# Patient Record
Sex: Male | Born: 1995 | Race: White | Hispanic: No | Marital: Single | Smoking: Current some day smoker
Health system: Southern US, Community
[De-identification: ages and names within clinical notes are randomized; demographics above are authoritative.]

## PROBLEM LIST (undated history)

## (undated) DIAGNOSIS — F32A Depression, unspecified: Secondary | ICD-10-CM

## (undated) DIAGNOSIS — G8929 Other chronic pain: Secondary | ICD-10-CM

## (undated) DIAGNOSIS — F329 Major depressive disorder, single episode, unspecified: Secondary | ICD-10-CM

## (undated) DIAGNOSIS — M92529 Juvenile osteochondrosis of tibia tubercle, unspecified leg: Secondary | ICD-10-CM

## (undated) DIAGNOSIS — IMO0002 Reserved for concepts with insufficient information to code with codable children: Secondary | ICD-10-CM

## (undated) DIAGNOSIS — F419 Anxiety disorder, unspecified: Secondary | ICD-10-CM

## (undated) DIAGNOSIS — M7650 Patellar tendinitis, unspecified knee: Secondary | ICD-10-CM

## (undated) DIAGNOSIS — M9250 Juvenile osteochondrosis of tibia and fibula, unspecified leg: Secondary | ICD-10-CM

## (undated) DIAGNOSIS — M25569 Pain in unspecified knee: Secondary | ICD-10-CM

## (undated) HISTORY — DX: Anxiety disorder, unspecified: F41.9

## (undated) HISTORY — PX: WISDOM TOOTH EXTRACTION: SHX21

---
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
## 2015-02-24 ENCOUNTER — Emergency Department (HOSPITAL_COMMUNITY)
Admission: EM | Admit: 2015-02-24 | Discharge: 2015-02-25 | Disposition: A | Discharge status: Home / Self Care | Payer: BLUE CROSS/BLUE SHIELD | Attending: Emergency Medicine | Admitting: Emergency Medicine

## 2015-02-24 ENCOUNTER — Encounter (HOSPITAL_COMMUNITY): Payer: Self-pay | Admitting: *Deleted

## 2015-02-24 DIAGNOSIS — M25561 Pain in right knee: Secondary | ICD-10-CM | POA: Diagnosis present

## 2015-02-24 DIAGNOSIS — F329 Major depressive disorder, single episode, unspecified: Secondary | ICD-10-CM | POA: Insufficient documentation

## 2015-02-24 DIAGNOSIS — R55 Syncope and collapse: Secondary | ICD-10-CM

## 2015-02-24 DIAGNOSIS — Z79899 Other long term (current) drug therapy: Secondary | ICD-10-CM | POA: Diagnosis not present

## 2015-02-24 DIAGNOSIS — G8929 Other chronic pain: Secondary | ICD-10-CM | POA: Insufficient documentation

## 2015-02-24 DIAGNOSIS — F419 Anxiety disorder, unspecified: Secondary | ICD-10-CM | POA: Insufficient documentation

## 2015-02-24 DIAGNOSIS — R000 Tachycardia, unspecified: Secondary | ICD-10-CM | POA: Diagnosis not present

## 2015-02-24 NOTE — ED Notes (Signed)
Pt states he has been having right knee pain an has been seeing an orthopedic doctor for it. Pt is on crutches that he had at home and has been using those for several months. Pt states that he began having worsening knee pain today and he has fallen several times a day. Mother states she has an app on her phone that measures heart rates. Mother states the highest her sons heart rate has been was 151.

## 2015-02-25 LAB — CBC WITH DIFFERENTIAL/PLATELET
Basophils Absolute: 0.2 10*3/uL — ABNORMAL HIGH (ref 0.0–0.1)
Basophils Relative: 2 % — ABNORMAL HIGH (ref 0–1)
EOS ABS: 0.8 10*3/uL — AB (ref 0.0–0.7)
EOS PCT: 8 % — AB (ref 0–5)
HEMATOCRIT: 43.6 % (ref 39.0–52.0)
Hemoglobin: 15 g/dL (ref 13.0–17.0)
LYMPHS PCT: 32 % (ref 12–46)
Lymphs Abs: 3.3 10*3/uL (ref 0.7–4.0)
MCH: 30.4 pg (ref 26.0–34.0)
MCHC: 34.4 g/dL (ref 30.0–36.0)
MCV: 88.4 fL (ref 78.0–100.0)
MONOS PCT: 8 % (ref 3–12)
Monocytes Absolute: 0.9 10*3/uL (ref 0.1–1.0)
NEUTROS ABS: 5.3 10*3/uL (ref 1.7–7.7)
Neutrophils Relative %: 50 % (ref 43–77)
PLATELETS: 200 10*3/uL (ref 150–400)
RBC: 4.93 MIL/uL (ref 4.22–5.81)
RDW: 13.2 % (ref 11.5–15.5)
WBC: 10.4 10*3/uL (ref 4.0–10.5)

## 2015-02-25 LAB — BASIC METABOLIC PANEL
ANION GAP: 9 (ref 5–15)
BUN: 17 mg/dL (ref 6–23)
CHLORIDE: 104 mmol/L (ref 96–112)
CO2: 28 mmol/L (ref 19–32)
Calcium: 9.1 mg/dL (ref 8.4–10.5)
Creatinine, Ser: 0.99 mg/dL (ref 0.50–1.35)
GFR calc Af Amer: >90 mL/min (ref >90)
GLUCOSE: 88 mg/dL (ref 70–99)
Potassium: 4 mmol/L (ref 3.5–5.1)
SODIUM: 141 mmol/L (ref 135–145)

## 2015-02-25 NOTE — ED Provider Notes (Addendum)
CSN: [id number]     Arrival date & time 02/24/15  2250 History   First MD Initiated Contact with Patient 02/25/15 0147     Chief Complaint  Patient presents with  . Knee Pain     (Consider location/radiation/quality/duration/timing/severity/associated sxs/prior Treatment) HPI  This is an 19 year old male with chronic pain in the right knee for over a year. He is currently being seen by an orthopedist in [place] as well as a pain clinic in [place]. He is currently on Neurontin, Lexapro and Belsomra. He states he is no longer taking the oxycodone he had been prescribed in March. He ambulates with crutches or a cane. He is here because for the past week he has been having episodes where he becomes weak and falls to the ground. He denies loss of consciousness. He is concerned that these falls may cause injury. He has been intermittently tachycardic and his mother states his heart rate has been as high as 151. He states the rapid heartbeat usually follows the episodes of weakness, not preceding them. He denies chest pain. He states the episodes are becoming more frequent.  Past Medical History  Diagnosis Date  . Osgood-Schlatter's disease   . Depression   . Anxiety   . Patellar tendonitis   . Chronic knee pain    History reviewed. No pertinent past surgical history. Family History  Problem Relation Age of Onset  . Diabetes Father   . ADD / ADHD Father   . Bipolar disorder Father   . Anxiety disorder Father   . Alcohol abuse Father   . Alcohol abuse Maternal Grandfather   . Anxiety disorder Maternal Grandfather   . Depression Maternal Grandfather   . Dementia Neg Hx   . Drug abuse Neg Hx   . OCD Neg Hx   . Paranoid behavior Neg Hx   . Schizophrenia Neg Hx   . Seizures Neg Hx   . Sexual abuse Neg Hx   . Physical abuse Neg Hx    History  Substance Use Topics  . Smoking status: Never Smoker   . Smokeless tobacco: Never Used  . Alcohol Use: No    Review of Systems   All other systems reviewed and are negative.   Allergies  Risperdal  Home Medications   Prior to Admission medications   Medication Sig Start Date End Date Taking? Authorizing Provider  escitalopram (LEXAPRO) 10 MG tablet Take 20 mg by mouth at bedtime.    Yes Historical Provider, MD  gabapentin (NEURONTIN) 600 MG tablet Take 600 mg by mouth 3 (three) times daily.   Yes Historical Provider, MD  hydroxypropyl methylcellulose / hypromellose (ISOPTO TEARS / GONIOVISC) 2.5 % ophthalmic solution Place 1 drop into both eyes as needed for dry eyes.   Yes Historical Provider, MD  Melatonin 5 MG TABS Take 5 mg by mouth at bedtime as needed (Sleep).    Yes Historical Provider, MD  oxymetazoline (AFRIN) 0.05 % nasal spray Place 2 sprays into the nose 2 (two) times daily as needed. For congestion    Yes Historical Provider, MD  Suvorexant (BELSOMRA) 10 MG TABS Take 10 mg by mouth at bedtime.   Yes Historical Provider, MD  PRESCRIPTION MEDICATION Apply 1 application topically 3 (three) times daily as needed (Pain). Diclofenac/Lidocaine/Menthol Cream [facility] in [place], [place]. Applies to Knee    Historical Provider, MD   BP 112/70 mmHg  Pulse 97  Temp(Src) 99.2 F (37.3 C) (Oral)  Resp 20  Ht 6' (  1.829 m)  Wt 157 lb 9.6 oz (71.487 kg)  BMI 21.37 kg/m2  SpO2 100%   Physical Exam  General: Well-developed, well-nourished male in no acute distress; appearance consistent with age of record HENT: normocephalic; atraumatic Eyes: pupils equal, round and reactive to light; extraocular muscles intact Neck: supple Heart: regular rate and rhythm; no murmur Lungs: clear to auscultation bilaterally Abdomen: soft; nondistended; nontender; no masses or hepatosplenomegaly; bowel sounds present Extremities: No deformity; full range of motion except right knee; pulses normal; tenderness of right knee with pain on attempted passive range of motion Neurologic: Awake, alert and oriented; motor  function intact in all extremities and symmetric; no facial droop Skin: Warm and dry Psychiatric: Normal mood and affect    ED Course  Procedures (including critical care time)   EKG Interpretation   Date/Time:  Saturday February 25 2015 00:28:19 EDT Ventricular Rate:  87 PR Interval:  105 QRS Duration: 84 QT Interval:  342 QTC Calculation: 411 R Axis:   90 Text Interpretation:  Sinus rhythm Short PR interval Borderline right axis  deviation ST elev, probable normal early repol pattern No old tracing to  compare Confirmed by [facility]  MD, Jonny RuizJOHN ([id number]) on 02/25/2015 1:03:16 AM      MDM  Nursing notes and vitals signs, including pulse oximetry, reviewed.  Summary of this visit's results, reviewed by myself:  Labs:  Results for orders placed or performed during the hospital encounter of 02/24/15 (from the past 24 hour(s))  CBC with Differential/Platelet     Status: Abnormal   Collection Time: 02/25/15  2:20 AM  Result Value Ref Range   WBC 10.4 4.0 - 10.5 K/uL   RBC 4.93 4.22 - 5.81 MIL/uL   Hemoglobin 15.0 13.0 - 17.0 g/dL   HCT 86.543.6 78.439.0 - 69.652.0 %   MCV 88.4 78.0 - 100.0 fL   MCH 30.4 26.0 - 34.0 pg   MCHC 34.4 30.0 - 36.0 g/dL   RDW 29.513.2 28.411.5 - 13.215.5 %   Platelets 200 150 - 400 K/uL   Neutrophils Relative % 50 43 - 77 %   Neutro Abs 5.3 1.7 - 7.7 K/uL   Lymphocytes Relative 32 12 - 46 %   Lymphs Abs 3.3 0.7 - 4.0 K/uL   Monocytes Relative 8 3 - 12 %   Monocytes Absolute 0.9 0.1 - 1.0 K/uL   Eosinophils Relative 8 (H) 0 - 5 %   Eosinophils Absolute 0.8 (H) 0.0 - 0.7 K/uL   Basophils Relative 2 (H) 0 - 1 %   Basophils Absolute 0.2 (H) 0.0 - 0.1 K/uL  Basic metabolic panel     Status: None   Collection Time: 02/25/15  2:20 AM  Result Value Ref Range   Sodium 141 135 - 145 mmol/L   Potassium 4.0 3.5 - 5.1 mmol/L   Chloride 104 96 - 112 mmol/L   CO2 28 19 - 32 mmol/L   Glucose, Bld 88 70 - 99 mg/dL   BUN 17 6 - 23 mg/dL   Creatinine, Ser 4.400.99 0.50 - 1.35 mg/dL    Calcium 9.1 8.4 - 10.210.5 mg/dL   GFR calc non Af Amer >90 >90 mL/min   GFR calc Af Amer >90 >90 mL/min   Anion gap 9 5 - 15   2:57 AM Patient mother advised of lab findings and orthostatics. The patient has been noted to be in sinus tachycardia intermittently. We will refer to cardiology for further evaluation and likely monitoring. They were advised that  this could also represent an adverse reaction to gabapentin.      Paula Libra, MD 02/25/15 4098  Paula Libra, MD 02/25/15 1191

## 2015-04-08 ENCOUNTER — Emergency Department (HOSPITAL_COMMUNITY)
Admission: EM | Admit: 2015-04-08 | Discharge: 2015-04-08 | Disposition: A | Discharge status: Home / Self Care | Payer: BLUE CROSS/BLUE SHIELD | Attending: Emergency Medicine | Admitting: Emergency Medicine

## 2015-04-08 ENCOUNTER — Encounter (HOSPITAL_COMMUNITY): Payer: Self-pay | Admitting: *Deleted

## 2015-04-08 DIAGNOSIS — F419 Anxiety disorder, unspecified: Secondary | ICD-10-CM | POA: Insufficient documentation

## 2015-04-08 DIAGNOSIS — Z79899 Other long term (current) drug therapy: Secondary | ICD-10-CM | POA: Insufficient documentation

## 2015-04-08 DIAGNOSIS — R000 Tachycardia, unspecified: Secondary | ICD-10-CM | POA: Insufficient documentation

## 2015-04-08 DIAGNOSIS — Z720 Tobacco use: Secondary | ICD-10-CM | POA: Diagnosis not present

## 2015-04-08 DIAGNOSIS — M25461 Effusion, right knee: Secondary | ICD-10-CM | POA: Diagnosis not present

## 2015-04-08 DIAGNOSIS — F329 Major depressive disorder, single episode, unspecified: Secondary | ICD-10-CM | POA: Insufficient documentation

## 2015-04-08 DIAGNOSIS — M25562 Pain in left knee: Secondary | ICD-10-CM | POA: Insufficient documentation

## 2015-04-08 DIAGNOSIS — G8929 Other chronic pain: Secondary | ICD-10-CM | POA: Diagnosis not present

## 2015-04-08 DIAGNOSIS — M25561 Pain in right knee: Secondary | ICD-10-CM | POA: Diagnosis not present

## 2015-04-08 MED ORDER — OXYCODONE-ACETAMINOPHEN 5-325 MG PO TABS: 2.0000 | ORAL_TABLET | ORAL | Status: DC | PRN

## 2015-04-08 MED ORDER — OXYCODONE-ACETAMINOPHEN 5-325 MG PO TABS
1.0000 | ORAL_TABLET | Freq: Once | ORAL | Status: AC
  Administered 2015-04-08: 1 via ORAL
  Filled 2015-04-08: qty 1

## 2015-04-08 NOTE — ED Provider Notes (Signed)
CSN: [id number]     Arrival date & time 04/08/15  1439 History   First MD Initiated Contact with Patient 04/08/15 1756     Chief Complaint  Patient presents with  . Knee Pain     (Consider location/radiation/quality/duration/timing/severity/associated sxs/prior Treatment) HPI Johnathan Lane is a 19 y.o. male who presents to the ED with knee pain. Hx of Osgood-Schlatter's disease, depression, anxiety, patellar tendonitis and chronic knee pain. His problem is partly due to one leg being shorter than the other and having subluxation of the right knee. Now he is having problems with his left knee. The symptoms have gotten progressively worse over the past week. He had been followed by Dr. Hilda Lias but has been referred to an orthopedic doctor at [facility]. His next appointment is June 8th.   Past Medical History  Diagnosis Date  . Osgood-Schlatter's disease   . Depression   . Anxiety   . Patellar tendonitis   . Chronic knee pain    Past Surgical History  Procedure Laterality Date  . Wisdom tooth extraction     Family History  Problem Relation Age of Onset  . Diabetes Father   . ADD / ADHD Father   . Bipolar disorder Father   . Anxiety disorder Father   . Alcohol abuse Father   . Alcohol abuse Maternal Grandfather   . Anxiety disorder Maternal Grandfather   . Depression Maternal Grandfather   . Dementia Neg Hx   . Drug abuse Neg Hx   . OCD Neg Hx   . Paranoid behavior Neg Hx   . Schizophrenia Neg Hx   . Seizures Neg Hx   . Sexual abuse Neg Hx   . Physical abuse Neg Hx    History  Substance Use Topics  . Smoking status: Current Some Day Smoker    Types: Cigarettes  . Smokeless tobacco: Never Used  . Alcohol Use: No    Review of Systems Negative except as stated in HPI   Allergies  Risperdal  Home Medications   Prior to Admission medications   Medication Sig Start Date End Date Taking? Authorizing Provider  acetaminophen (TYLENOL) 500 MG tablet Take 1,000 mg  by mouth every 6 (six) hours as needed for mild pain.   Yes Historical Provider, MD  busPIRone (BUSPAR) 10 MG tablet Take 10 mg by mouth 3 (three) times daily.   Yes Historical Provider, MD  escitalopram (LEXAPRO) 10 MG tablet Take 20 mg by mouth at bedtime.    Yes Historical Provider, MD  gabapentin (NEURONTIN) 400 MG capsule Take 1,600 mg by mouth 3 (three) times daily.   Yes Historical Provider, MD  levETIRAcetam (KEPPRA) 250 MG tablet Take 500 mg by mouth 2 (two) times daily.   Yes Historical Provider, MD  oxymetazoline (AFRIN) 0.05 % nasal spray Place 2 sprays into the nose 2 (two) times daily as needed. For congestion    Yes Historical Provider, MD  Suvorexant (BELSOMRA) 10 MG TABS Take 15 mg by mouth at bedtime.    Yes Historical Provider, MD  Melatonin 5 MG TABS Take 5 mg by mouth at bedtime as needed (Sleep).     Historical Provider, MD  oxyCODONE-acetaminophen (PERCOCET/ROXICET) 5-325 MG per tablet Take 2 tablets by mouth every 4 (four) hours as needed for severe pain. 04/08/15   Hope Orlene Och, NP   BP 114/64 mmHg  Pulse 113  Temp(Src) 97.6 F (36.4 C) (Oral)  SpO2 100% Physical Exam  Constitutional: He is oriented to person,  place, and time. He appears well-developed and well-nourished. No distress.  Eyes: EOM are normal.  Neck: Neck supple.  Cardiovascular: Tachycardia present.   Pulmonary/Chest: Effort normal.  Abdominal: Soft. There is no tenderness.  Musculoskeletal:       Right knee: He exhibits decreased range of motion (due to pain) and swelling. He exhibits no deformity, no laceration and no erythema. Tenderness found.       Left knee: He exhibits normal range of motion, no swelling, no ecchymosis, no deformity, no laceration, no erythema and normal alignment. Tenderness found.       Legs: Pedal pulses equal bilateral, adequate circulation.   Neurological: He is alert and oriented to person, place, and time. No cranial nerve deficit.  Skin: Skin is warm and dry.    Psychiatric: He has a normal mood and affect. His behavior is normal.  Nursing note and vitals reviewed.   ED Course  Procedures   MDM  19 y.o. male with chronic knee pain and followed by ortho. Discussed that he will need to see his doctor for continued pain management but we will give him a pre pack of oxycodone until he can call his doctor. Patient and his mother voice understanding and agree with plan.   Final diagnoses:  Bilateral chronic knee pain      [facility] Neese, NP 04/10/15 0245  Raeford RazorStephen Kohut, MD 04/11/15 [id number]

## 2015-04-08 NOTE — ED Notes (Addendum)
1 week history of knee pain worsening over course of week. Started having falls Wednesday, feeling as if knees lock and he falls onto them. States one leg is shorter than other and he has subluxation of R knee. Now L leg knee is problematic. Has Osgood-Schlatter's Dz.

## 2015-04-26 MED FILL — Oxycodone w/ Acetaminophen Tab 5-325 MG: ORAL | Qty: 6 | Status: AC

## 2015-05-12 ENCOUNTER — Encounter (HOSPITAL_COMMUNITY): Payer: Self-pay | Admitting: *Deleted

## 2015-05-12 ENCOUNTER — Emergency Department (HOSPITAL_COMMUNITY)
Admission: EM | Admit: 2015-05-12 | Discharge: 2015-05-12 | Disposition: A | Discharge status: Home / Self Care | Payer: BLUE CROSS/BLUE SHIELD | Attending: Emergency Medicine | Admitting: Emergency Medicine

## 2015-05-12 DIAGNOSIS — F329 Major depressive disorder, single episode, unspecified: Secondary | ICD-10-CM | POA: Insufficient documentation

## 2015-05-12 DIAGNOSIS — Z79899 Other long term (current) drug therapy: Secondary | ICD-10-CM | POA: Insufficient documentation

## 2015-05-12 DIAGNOSIS — F419 Anxiety disorder, unspecified: Secondary | ICD-10-CM | POA: Insufficient documentation

## 2015-05-12 DIAGNOSIS — Z8719 Personal history of other diseases of the digestive system: Secondary | ICD-10-CM | POA: Diagnosis not present

## 2015-05-12 DIAGNOSIS — G8929 Other chronic pain: Secondary | ICD-10-CM | POA: Diagnosis not present

## 2015-05-12 DIAGNOSIS — Z720 Tobacco use: Secondary | ICD-10-CM | POA: Insufficient documentation

## 2015-05-12 DIAGNOSIS — M25561 Pain in right knee: Secondary | ICD-10-CM | POA: Insufficient documentation

## 2015-05-12 MED ORDER — OXYCODONE-ACETAMINOPHEN 5-325 MG PO TABS: 2.0000 | ORAL_TABLET | ORAL | Status: DC | PRN

## 2015-05-12 MED ORDER — OXYCODONE-ACETAMINOPHEN 5-325 MG PO TABS
1.0000 | ORAL_TABLET | Freq: Once | ORAL | Status: AC
  Administered 2015-05-12: 1 via ORAL
  Filled 2015-05-12: qty 1

## 2015-05-12 NOTE — ED Notes (Signed)
Pt has a hx of right knee pain and is seen by the [facility]. Pt's knee started hurting on Thursday. Pt's mother called [facility] and they were told to come to the ED.

## 2015-05-12 NOTE — Discharge Instructions (Signed)
Follow up with your doctor at [facility] as planned. Return here as needed.

## 2015-05-12 NOTE — ED Provider Notes (Signed)
CSN: [id number]     Arrival date & time 05/12/15  2234 History   First MD Initiated Contact with Patient 05/12/15 2246     Chief Complaint  Patient presents with  . Knee Pain     (Consider location/radiation/quality/duration/timing/severity/associated sxs/prior Treatment) Patient is a 19 y.o. male presenting with knee pain. The history is provided by the patient and a parent.  Knee Pain Location:  Knee Injury: no   Knee location:  R knee Pain details:    Quality:  Aching and shooting   Severity:  Severe   Onset quality:  Gradual   Duration:  2 days   Timing:  Constant   Progression:  Worsening  Johnathan Lane is a 19 y.o. male who presents to the ED with his mother for right knee pain. Patient has a long history of knee problems. He had Osgood-Schlatter's disease and patellar tendonitis and developed chronic knee pain. He was evaluated by Dr. Hilda Lias and remained under his care for about a year and has been in PT. His care was transferred from Dr. Hilda Lias to [facility]. After a few months there the patient's mother transferred care to [facility]. At [facility] he sees an orthopedic doctor and goes to a pain clinic. They have scheduled him for a nerve block on the right that will take place in the next few weeks. He is not taking narcotics on a regular basis. He is taking Neurontin and Cymbalta. These medications have worked well but over the past 2 days he has had a flair up of the knee pain. Patient's mother called the pain clinic at [facility] today and was instructed to go to ED for pain medication and they would make a note in his chart.   Past Medical History  Diagnosis Date  . Osgood-Schlatter's disease   . Depression   . Anxiety   . Patellar tendonitis   . Chronic knee pain    Past Surgical History  Procedure Laterality Date  . Wisdom tooth extraction     Family History  Problem Relation Age of Onset  . Diabetes Father   . ADD / ADHD Father   . Bipolar disorder Father   . Anxiety  disorder Father   . Alcohol abuse Father   . Alcohol abuse Maternal Grandfather   . Anxiety disorder Maternal Grandfather   . Depression Maternal Grandfather   . Dementia Neg Hx   . Drug abuse Neg Hx   . OCD Neg Hx   . Paranoid behavior Neg Hx   . Schizophrenia Neg Hx   . Seizures Neg Hx   . Sexual abuse Neg Hx   . Physical abuse Neg Hx    History  Substance Use Topics  . Smoking status: Current Some Day Smoker    Types: Cigarettes  . Smokeless tobacco: Never Used  . Alcohol Use: No    Review of Systems Negative except as stated in HPI   Allergies  Risperdal  Home Medications   Prior to Admission medications   Medication Sig Start Date End Date Taking? Authorizing Provider  acetaminophen (TYLENOL) 500 MG tablet Take 1,000 mg by mouth every 6 (six) hours as needed for mild pain.    Historical Provider, MD  busPIRone (BUSPAR) 10 MG tablet Take 10 mg by mouth 3 (three) times daily.    Historical Provider, MD  escitalopram (LEXAPRO) 10 MG tablet Take 20 mg by mouth at bedtime.     Historical Provider, MD  gabapentin (NEURONTIN) 400 MG capsule  Take 1,600 mg by mouth 3 (three) times daily.    Historical Provider, MD  levETIRAcetam (KEPPRA) 250 MG tablet Take 500 mg by mouth 2 (two) times daily.    Historical Provider, MD  Melatonin 5 MG TABS Take 5 mg by mouth at bedtime as needed (Sleep).     Historical Provider, MD  oxyCODONE-acetaminophen (PERCOCET/ROXICET) 5-325 MG per tablet Take 2 tablets by mouth every 4 (four) hours as needed for severe pain. 05/12/15   Hope Orlene OchM Neese, NP  oxymetazoline (AFRIN) 0.05 % nasal spray Place 2 sprays into the nose 2 (two) times daily as needed. For congestion     Historical Provider, MD  Suvorexant (BELSOMRA) 10 MG TABS Take 15 mg by mouth at bedtime.     Historical Provider, MD   BP 109/60 mmHg  Pulse 86  Temp(Src) 98.4 F (36.9 C) (Oral)  Resp 14  Ht 6\' 1"  (1.854 m)  Wt 160 lb (72.576 kg)  BMI 21.11 kg/m2  SpO2 100% Physical Exam    Constitutional: He is oriented to person, place, and time. He appears well-developed and well-nourished. No distress.  HENT:  Head: Normocephalic and atraumatic.  Eyes: EOM are normal.  Neck: Neck supple.  Cardiovascular: Normal rate.   Pulmonary/Chest: Effort normal.  Musculoskeletal:       Right knee: He exhibits swelling. He exhibits no erythema and normal alignment. Decreased range of motion: due to pain. Tenderness found.       Legs: Pedal pulses 2+ bilateral.   Neurological: He is alert and oriented to person, place, and time. No cranial nerve deficit.  Skin: Skin is warm and dry.  Psychiatric: He has a normal mood and affect. His behavior is normal.  Nursing note and vitals reviewed.   ED Course  Procedures   MDM  19 y.o. male with right knee pain stable for d/c without neurovascular compromise. [facility]. Discussed with the patient and his mother plan of care and all questioned fully answered.   Final diagnoses:  Right knee pain       SweetserHope M Neese, NP 05/13/15 [phone]  Bethann BerkshireJoseph Zammit, MD 05/13/15 1323

## 2015-05-16 ENCOUNTER — Emergency Department (HOSPITAL_COMMUNITY): Payer: BLUE CROSS/BLUE SHIELD

## 2015-05-16 ENCOUNTER — Emergency Department (HOSPITAL_COMMUNITY)
Admission: EM | Admit: 2015-05-16 | Discharge: 2015-05-16 | Disposition: A | Discharge status: Home / Self Care | Payer: BLUE CROSS/BLUE SHIELD | Attending: Emergency Medicine | Admitting: Emergency Medicine

## 2015-05-16 ENCOUNTER — Encounter (HOSPITAL_COMMUNITY): Payer: Self-pay | Admitting: Emergency Medicine

## 2015-05-16 DIAGNOSIS — F419 Anxiety disorder, unspecified: Secondary | ICD-10-CM | POA: Diagnosis not present

## 2015-05-16 DIAGNOSIS — S8991XA Unspecified injury of right lower leg, initial encounter: Secondary | ICD-10-CM | POA: Diagnosis present

## 2015-05-16 DIAGNOSIS — W01198A Fall on same level from slipping, tripping and stumbling with subsequent striking against other object, initial encounter: Secondary | ICD-10-CM | POA: Diagnosis not present

## 2015-05-16 DIAGNOSIS — G8929 Other chronic pain: Secondary | ICD-10-CM | POA: Insufficient documentation

## 2015-05-16 DIAGNOSIS — Y9289 Other specified places as the place of occurrence of the external cause: Secondary | ICD-10-CM | POA: Insufficient documentation

## 2015-05-16 DIAGNOSIS — Y9389 Activity, other specified: Secondary | ICD-10-CM | POA: Insufficient documentation

## 2015-05-16 DIAGNOSIS — R200 Anesthesia of skin: Secondary | ICD-10-CM | POA: Insufficient documentation

## 2015-05-16 DIAGNOSIS — Z720 Tobacco use: Secondary | ICD-10-CM | POA: Diagnosis not present

## 2015-05-16 DIAGNOSIS — F329 Major depressive disorder, single episode, unspecified: Secondary | ICD-10-CM | POA: Insufficient documentation

## 2015-05-16 DIAGNOSIS — Z8739 Personal history of other diseases of the musculoskeletal system and connective tissue: Secondary | ICD-10-CM | POA: Insufficient documentation

## 2015-05-16 DIAGNOSIS — Y998 Other external cause status: Secondary | ICD-10-CM | POA: Diagnosis not present

## 2015-05-16 DIAGNOSIS — Z79899 Other long term (current) drug therapy: Secondary | ICD-10-CM | POA: Insufficient documentation

## 2015-05-16 DIAGNOSIS — M25561 Pain in right knee: Secondary | ICD-10-CM

## 2015-05-16 DIAGNOSIS — R52 Pain, unspecified: Secondary | ICD-10-CM

## 2015-05-16 MED ORDER — HYDROCODONE-ACETAMINOPHEN 5-325 MG PO TABS: ORAL_TABLET | ORAL | Status: DC

## 2015-05-16 MED ORDER — IBUPROFEN 800 MG PO TABS
800.0000 mg | ORAL_TABLET | Freq: Once | ORAL | Status: AC
  Administered 2015-05-16: 800 mg via ORAL
  Filled 2015-05-16: qty 1

## 2015-05-16 MED ORDER — IBUPROFEN 800 MG PO TABS: 800.0000 mg | ORAL_TABLET | Freq: Three times a day (TID) | ORAL | Status: DC

## 2015-05-16 MED ORDER — HYDROCODONE-ACETAMINOPHEN 5-325 MG PO TABS
1.0000 | ORAL_TABLET | Freq: Once | ORAL | Status: AC
  Administered 2015-05-16: 1 via ORAL
  Filled 2015-05-16: qty 1

## 2015-05-16 NOTE — ED Notes (Signed)
PT c/o falling onto his right knee x2 days with numbness to right foot and inability to walk without use of crutches d/t pain.

## 2015-05-16 NOTE — ED Provider Notes (Signed)
CSN: [id number]     Arrival date & time 05/16/15  1248 History   First MD Initiated Contact with Patient 05/16/15 1311     Chief Complaint  Patient presents with  . Knee Pain     (Consider location/radiation/quality/duration/timing/severity/associated sxs/prior Treatment) HPI  Johnathan Lane is a 19 y.o. male who presents to the Emergency Department complaining of right knee pain and new onset numbness of his right foot.  He states the numbness of his foot began after a fall two days ago in which he landed on his knee.  Mother of the patient reports chronic knee pain and he is followed by an orthopedist at [facility].  He is scheduled to have a nerve block performed soon.  Patient states the numbness in his foot has progressed since the fall and he now states that he is unable to flex his foot.  Mother states he has been using crutches to walk.  She contacted his orthopedist and was advised to come to ED for evaluation.  He denies other injuries, swelling, open wounds, or calf pain   Past Medical History  Diagnosis Date  . Osgood-Schlatter's disease   . Depression   . Anxiety   . Patellar tendonitis   . Chronic knee pain    Past Surgical History  Procedure Laterality Date  . Wisdom tooth extraction     Family History  Problem Relation Age of Onset  . Diabetes Father   . ADD / ADHD Father   . Bipolar disorder Father   . Anxiety disorder Father   . Alcohol abuse Father   . Alcohol abuse Maternal Grandfather   . Anxiety disorder Maternal Grandfather   . Depression Maternal Grandfather   . Dementia Neg Hx   . Drug abuse Neg Hx   . OCD Neg Hx   . Paranoid behavior Neg Hx   . Schizophrenia Neg Hx   . Seizures Neg Hx   . Sexual abuse Neg Hx   . Physical abuse Neg Hx    History  Substance Use Topics  . Smoking status: Current Some Day Smoker    Types: Cigarettes  . Smokeless tobacco: Never Used  . Alcohol Use: No    Review of Systems  Constitutional: Negative for fever  and chills.  Genitourinary: Negative for dysuria and difficulty urinating.  Musculoskeletal: Positive for arthralgias (right knee pain). Negative for joint swelling.  Skin: Negative for color change and wound.  Neurological: Positive for numbness (numbness, tingling of the right foot).  All other systems reviewed and are negative.     Allergies  Risperdal  Home Medications   Prior to Admission medications   Medication Sig Start Date End Date Taking? Authorizing Provider  busPIRone (BUSPAR) 15 MG tablet Take 15 mg by mouth 3 (three) times daily.   Yes Historical Provider, MD  diazepam (VALIUM) 5 MG tablet Take 5 mg by mouth 3 (three) times daily.   Yes Historical Provider, MD  DULoxetine (CYMBALTA) 30 MG capsule Take 30 mg by mouth daily. Takes with 60 mg capsule to =90 mg   Yes Historical Provider, MD  DULoxetine (CYMBALTA) 60 MG capsule Take 60 mg by mouth daily. Takes with 30 mg capsule to =90 mg   Yes Historical Provider, MD  oxyCODONE-acetaminophen (PERCOCET/ROXICET) 5-325 MG per tablet Take 2 tablets by mouth every 4 (four) hours as needed for severe pain. Patient not taking: Reported on 05/16/2015 05/12/15   Janne Napoleon, NP   BP 108/66 mmHg  Pulse  92  Temp(Src) 98 F (36.7 C) (Oral)  Resp 18  Ht 6' (1.829 m)  Wt 160 lb (72.576 kg)  BMI 21.70 kg/m2  SpO2 99% Physical Exam  Constitutional: He is oriented to person, place, and time. He appears well-developed and well-nourished. No distress.  Cardiovascular: Normal rate, regular rhythm, normal heart sounds and intact distal pulses.   Pulmonary/Chest: Effort normal and breath sounds normal.  Musculoskeletal: He exhibits tenderness. He exhibits no edema.  ttp of the anterior knee, over the patella.  No erythema, effusion, or step-off deformity.  DP pulse brisk, gross distal sensation intact. Patient unable to dorsiflex or plantar flex the right foot.  Calf is soft and NT.  Neurological: He is alert and oriented to person, place,  and time. He exhibits normal muscle tone. Coordination normal.  Skin: Skin is warm and dry. No erythema.  Nursing note and vitals reviewed.   ED Course  Procedures (including critical care time) Labs Review Labs Reviewed - No data to display  Imaging Review Dg Knee Complete 4 Views Right  05/16/2015   CLINICAL DATA:  Two day history of progressive knee pain after fall  EXAM: RIGHT KNEE - COMPLETE 4+ VIEW  COMPARISON:  September 30, 2014  FINDINGS: Frontal, lateral, and bilateral oblique views were obtained. There is no fracture or dislocation. Joint spaces appear intact. No effusion. No erosive change.  IMPRESSION: No fracture or effusion.  No appreciable arthropathy.   Electronically Signed   By: Bretta BangWilliam  Woodruff III M.D.   On: 05/16/2015 13:42     EKG Interpretation None      MDM   Final diagnoses:  Pain  Knee pain, right    Patient seen by Dr. Estell HarpinZammit and care plan discussed which includes MRI of the knee.    1715  End of shift, MRI still pending.  Patient signed out to Dr. Estell HarpinZammit who will review MRI and arrange dispo  Pauline Ausammy Joncarlos Atkison, PA-C 05/16/15 2135  Bethann BerkshireJoseph Zammit, MD 05/18/15 1414

## 2015-05-16 NOTE — Discharge Instructions (Signed)
Knee Pain °Knee pain can be a result of an injury or other medical conditions. Treatment will depend on the cause of your pain. °HOME CARE °· Only take medicine as told by your doctor. °· Keep a healthy weight. Being overweight can make the knee hurt more. °· Stretch before exercising or playing sports. °· If there is constant knee pain, change the way you exercise. Ask your doctor for advice. °· Make sure shoes fit well. Choose the right shoe for the sport or activity. °· Protect your knees. Wear kneepads if needed. °· Rest when you are tired. °GET HELP RIGHT AWAY IF:  °· Your knee pain does not stop. °· Your knee pain does not get better. °· Your knee joint feels hot to the touch. °· You have a fever. °MAKE SURE YOU:  °· Understand these instructions. °· Will watch this condition. °· Will get help right away if you are not doing well or get worse. °Document Released: 01/24/2009 Document Revised: 01/20/2012 Document Reviewed: 01/24/2009 °ExitCare® Patient Information ©2015 [employer]. This information is not intended to replace advice given to you by your health care provider. Make sure you discuss any questions you have with your health care provider. ° °

## 2015-05-22 ENCOUNTER — Emergency Department (HOSPITAL_COMMUNITY)
Admission: EM | Admit: 2015-05-22 | Discharge: 2015-05-22 | Disposition: A | Discharge status: Home / Self Care | Payer: BLUE CROSS/BLUE SHIELD | Attending: Emergency Medicine | Admitting: Emergency Medicine

## 2015-05-22 ENCOUNTER — Encounter (HOSPITAL_COMMUNITY): Payer: Self-pay | Admitting: Emergency Medicine

## 2015-05-22 ENCOUNTER — Telehealth: Payer: Self-pay | Admitting: Orthopedic Surgery

## 2015-05-22 DIAGNOSIS — Z720 Tobacco use: Secondary | ICD-10-CM | POA: Insufficient documentation

## 2015-05-22 DIAGNOSIS — G8929 Other chronic pain: Secondary | ICD-10-CM | POA: Insufficient documentation

## 2015-05-22 DIAGNOSIS — F329 Major depressive disorder, single episode, unspecified: Secondary | ICD-10-CM | POA: Insufficient documentation

## 2015-05-22 DIAGNOSIS — Z79899 Other long term (current) drug therapy: Secondary | ICD-10-CM | POA: Insufficient documentation

## 2015-05-22 DIAGNOSIS — F419 Anxiety disorder, unspecified: Secondary | ICD-10-CM | POA: Diagnosis not present

## 2015-05-22 DIAGNOSIS — Z791 Long term (current) use of non-steroidal anti-inflammatories (NSAID): Secondary | ICD-10-CM | POA: Insufficient documentation

## 2015-05-22 DIAGNOSIS — M25561 Pain in right knee: Secondary | ICD-10-CM | POA: Diagnosis not present

## 2015-05-22 MED ORDER — MORPHINE SULFATE 4 MG/ML IJ SOLN
6.0000 mg | Freq: Once | INTRAMUSCULAR | Status: AC
  Administered 2015-05-22: 6 mg via INTRAMUSCULAR
  Filled 2015-05-22: qty 2

## 2015-05-22 MED ORDER — KETOROLAC TROMETHAMINE 60 MG/2ML IM SOLN
60.0000 mg | Freq: Once | INTRAMUSCULAR | Status: AC
  Administered 2015-05-22: 60 mg via INTRAMUSCULAR
  Filled 2015-05-22: qty 2

## 2015-05-22 MED ORDER — HYDROMORPHONE HCL 1 MG/ML IJ SOLN: 1.0000 mg | Freq: Once | INTRAMUSCULAR | Status: DC

## 2015-05-22 MED ORDER — NAPROXEN 375 MG PO TABS: 375.0000 mg | ORAL_TABLET | Freq: Two times a day (BID) | ORAL | Status: DC | PRN

## 2015-05-22 NOTE — ED Provider Notes (Signed)
CSN: [id number]     Arrival date & time 05/22/15  0654 History   First MD Initiated Contact with Patient 05/22/15 [phone]     Chief Complaint  Patient presents with  . Knee Pain     (Consider location/radiation/quality/duration/timing/severity/associated sxs/prior Treatment) HPI Comments: 19 year old male with history of anxiety, panic disorder, Osgood-Schlatter, patella tendinitis, cigarette smoker presents with recurrent right anterior knee pain worse with light palpation and any movement. Patient has had recurrent flares for the past 3-4 years. Patient seen local orthopedics recommended pain management. Patient saw [facility] pain management and more recently do pain management. Patient has an appointment in the future however patient's pain is not being controlled. Patient has had worsening pain for the past week, patient did have a low risk fall and had an MRI which showed no acute findings reviewed results by myself. The mother and patient are frustrated as patient is not able to do normal activities due to pain. Patient has no back pain, no back surgeries, no weakness in his legs, no bowel or bladder changes. No fevers or chills.  Patient is a 19 y.o. male presenting with knee pain. The history is provided by the patient and a parent.  Knee Pain Associated symptoms: no back pain, no fever and no neck pain     Past Medical History  Diagnosis Date  . Osgood-Schlatter's disease   . Depression   . Anxiety   . Patellar tendonitis   . Chronic knee pain    Past Surgical History  Procedure Laterality Date  . Wisdom tooth extraction     Family History  Problem Relation Age of Onset  . Diabetes Father   . ADD / ADHD Father   . Bipolar disorder Father   . Anxiety disorder Father   . Alcohol abuse Father   . Alcohol abuse Maternal Grandfather   . Anxiety disorder Maternal Grandfather   . Depression Maternal Grandfather   . Dementia Neg Hx   . Drug abuse Neg Hx   . OCD Neg Hx   .  Paranoid behavior Neg Hx   . Schizophrenia Neg Hx   . Seizures Neg Hx   . Sexual abuse Neg Hx   . Physical abuse Neg Hx    History  Substance Use Topics  . Smoking status: Current Some Day Smoker    Types: Cigarettes  . Smokeless tobacco: Never Used  . Alcohol Use: No    Review of Systems  Constitutional: Negative for fever and chills.  HENT: Negative for congestion.   Eyes: Negative for visual disturbance.  Respiratory: Negative for shortness of breath.   Cardiovascular: Negative for chest pain.  Gastrointestinal: Negative for vomiting and abdominal pain.  Genitourinary: Negative for dysuria and flank pain.  Musculoskeletal: Positive for gait problem. Negative for back pain, joint swelling, neck pain and neck stiffness.  Skin: Negative for rash.  Neurological: Negative for light-headedness and headaches.  Psychiatric/Behavioral: The patient is nervous/anxious.       Allergies  Risperdal  Home Medications   Prior to Admission medications   Medication Sig Start Date End Date Taking? Authorizing Provider  busPIRone (BUSPAR) 15 MG tablet Take 15 mg by mouth 3 (three) times daily.    Historical Provider, MD  diazepam (VALIUM) 5 MG tablet Take 5 mg by mouth 3 (three) times daily.    Historical Provider, MD  DULoxetine (CYMBALTA) 30 MG capsule Take 30 mg by mouth daily. Takes with 60 mg capsule to =90 mg    Historical  Provider, MD  DULoxetine (CYMBALTA) 60 MG capsule Take 60 mg by mouth daily. Takes with 30 mg capsule to =90 mg    Historical Provider, MD  HYDROcodone-acetaminophen (NORCO/VICODIN) 5-325 MG per tablet Take one tab po q 4-6 hrs prn pain 05/16/15   Tammy Triplett, PA-C  ibuprofen (ADVIL,MOTRIN) 800 MG tablet Take 1 tablet (800 mg total) by mouth 3 (three) times daily. 05/16/15   Tammy Triplett, PA-C  naproxen (NAPROSYN) 375 MG tablet Take 1 tablet (375 mg total) by mouth 2 (two) times daily as needed. 05/22/15   Blane OharaJoshua Senta Kantor, MD   BP 109/70 mmHg  Pulse 83  Temp(Src)  98.6 F (37 C) (Oral)  Resp 16  Ht 6\' 1"  (1.854 m)  Wt 160 lb (72.576 kg)  BMI 21.11 kg/m2  SpO2 100% Physical Exam  Constitutional: He is oriented to person, place, and time. He appears well-developed and well-nourished.  HENT:  Head: Normocephalic and atraumatic.  Eyes: Conjunctivae are normal. Right eye exhibits no discharge. Left eye exhibits no discharge.  Neck: Normal range of motion. Neck supple. No tracheal deviation present.  Cardiovascular: Normal rate and regular rhythm.   Pulmonary/Chest: Effort normal.  Abdominal: Soft.  Musculoskeletal: He exhibits tenderness. He exhibits no edema.  Patient has tenderness to very light palpation of entire knee, worse anterior. Patient has full range of motion with discomfort, no effusion, no warmth to the joint, no ligament laxity on stress testing. Distal pulses intact. Patient has no lumbar tenderness on exam. No rash on the right leg.  Neurological: He is alert and oriented to person, place, and time.  Reflex Scores:      Patellar reflexes are 2+ on the right side and 2+ on the left side.      Achilles reflexes are 2+ on the right side and 2+ on the left side. Patient has 5+ strength with flexion extension of hip knee ankle and great toes bilateral. Mild giveaway due to pain on the right. Patient sensation intact to sharp and palpation in major nerves in lower extremities.  Skin: Skin is warm. No rash noted.  Psychiatric: He has a normal mood and affect.  Nursing note and vitals reviewed.   ED Course  Procedures (including critical care time) Labs Review Labs Reviewed - No data to display  Imaging Review No results found.   EKG Interpretation None      MDM   Final diagnoses:  Right knee pain   Patient with recurrent flares of right knee pain, no concern for septic joint, recent MRI results reviewed by myseklf. patient has seen orthopedics and pain management. Mother requesting pain med prescription, long discussion  regarding risks of narcotics and especially with his young age I do not feel narcotics outpatient indicated. I will try to control his acute pain with intramuscular injections and send the patient on Monday naproxen. Discussed with mother and patient for further physical therapy and primary Dr. to coordinate pain management.  Results and differential diagnosis were discussed with the patient/parent/guardian. Xrays were independently reviewed by myself.  Close follow up outpatient was discussed, comfortable with the plan.   Medications  ketorolac (TORADOL) injection 60 mg (60 mg Intramuscular Given 05/22/15 0803)  morphine 4 MG/ML injection 6 mg (6 mg Intramuscular Given 05/22/15 0803)    Filed Vitals:   05/22/15 0705 05/22/15 0802  BP: 122/85 109/70  Pulse: 101 83  Temp: 98.6 F (37 C)   TempSrc: Oral   Resp: 16   Height: 6\' 1"  (1.854  m)   Weight: 160 lb (72.576 kg)   SpO2: 99% 100%    Final diagnoses:  Right knee pain       Blane Ohara, MD 05/22/15 [phone]

## 2015-05-22 NOTE — ED Notes (Signed)
Pt states fell last Sunday and injured right knee.  States has chronic pain issues with this knee and is seeing [facility] pain clinic.

## 2015-05-22 NOTE — Telephone Encounter (Signed)
Patient/mother called to inquire about appointment as a follow-up to multiple recent visits to [facility] for Right knee pain. Relayed we are to speak with patient, due to age 19; Mother aware, and son was there, who preferred mom to relay.  Patient was advised to contact our office for right knee evaluation, however, states already seen by Dr [name] for patellar tendonitis, and [facility], then to [facility] for [facility] and pain management; states has "nerve pain", which I relayed Dr Romeo AppleHarrison does not treat, but said needs to be seen for the knee pain, while other [facility] follow [facility] is [facility]. Please advise.  Patient's ph# is [phone]

## 2015-05-22 NOTE — ED Notes (Signed)
Pt made aware to return if symptoms worsen or if any life threatening symptoms occur.   

## 2015-05-22 NOTE — Discharge Instructions (Signed)
If you were given medicines take as directed.  If you are on coumadin or contraceptives realize their levels and effectiveness is altered by many different medicines.  If you have any reaction (rash, tongues swelling, other) to the medicines stop taking and see a physician.    If your blood pressure was elevated in the ER make sure you follow up for management with a primary doctor or return for chest pain, shortness of breath or stroke symptoms.  Please follow up as directed and return to the ER or see a physician for new or worsening symptoms.  Thank you. Filed Vitals:   05/22/15 0705 05/22/15 0802  BP: 122/85 109/70  Pulse: 101 83  Temp: 98.6 F (37 C)   TempSrc: Oral   Resp: 16   Height: 6\' 1"  (1.854 m)   Weight: 160 lb (72.576 kg)   SpO2: 99% 100%   Please call pain clinic for cancellations and earlier appointment. Please call local physical therapy and ortho until you have pain appointment. One PT center below. [phone] [address] [place], [place] [address]

## 2015-05-22 NOTE — Telephone Encounter (Signed)
[facility]  [facility]

## 2015-05-23 NOTE — Telephone Encounter (Signed)
Called back to notify of Dr Harrison's response; patient aware.

## 2015-05-25 MED FILL — Oxycodone w/ Acetaminophen Tab 5-325 MG: ORAL | Qty: 6 | Status: AC

## 2015-05-31 ENCOUNTER — Emergency Department (HOSPITAL_COMMUNITY)
Admission: EM | Admit: 2015-05-31 | Discharge: 2015-05-31 | Disposition: A | Discharge status: Home / Self Care | Payer: BLUE CROSS/BLUE SHIELD | Attending: Emergency Medicine | Admitting: Emergency Medicine

## 2015-05-31 ENCOUNTER — Emergency Department (HOSPITAL_COMMUNITY): Payer: BLUE CROSS/BLUE SHIELD

## 2015-05-31 ENCOUNTER — Encounter (HOSPITAL_COMMUNITY): Payer: Self-pay | Admitting: Emergency Medicine

## 2015-05-31 DIAGNOSIS — F329 Major depressive disorder, single episode, unspecified: Secondary | ICD-10-CM | POA: Diagnosis not present

## 2015-05-31 DIAGNOSIS — G8929 Other chronic pain: Secondary | ICD-10-CM | POA: Diagnosis not present

## 2015-05-31 DIAGNOSIS — S80211A Abrasion, right knee, initial encounter: Secondary | ICD-10-CM | POA: Diagnosis not present

## 2015-05-31 DIAGNOSIS — Z720 Tobacco use: Secondary | ICD-10-CM | POA: Insufficient documentation

## 2015-05-31 DIAGNOSIS — Y998 Other external cause status: Secondary | ICD-10-CM | POA: Diagnosis not present

## 2015-05-31 DIAGNOSIS — F419 Anxiety disorder, unspecified: Secondary | ICD-10-CM | POA: Insufficient documentation

## 2015-05-31 DIAGNOSIS — Z8739 Personal history of other diseases of the musculoskeletal system and connective tissue: Secondary | ICD-10-CM | POA: Diagnosis not present

## 2015-05-31 DIAGNOSIS — Y9289 Other specified places as the place of occurrence of the external cause: Secondary | ICD-10-CM | POA: Diagnosis not present

## 2015-05-31 DIAGNOSIS — Z79899 Other long term (current) drug therapy: Secondary | ICD-10-CM | POA: Diagnosis not present

## 2015-05-31 DIAGNOSIS — Y9389 Activity, other specified: Secondary | ICD-10-CM | POA: Insufficient documentation

## 2015-05-31 DIAGNOSIS — M25561 Pain in right knee: Secondary | ICD-10-CM

## 2015-05-31 DIAGNOSIS — W01198A Fall on same level from slipping, tripping and stumbling with subsequent striking against other object, initial encounter: Secondary | ICD-10-CM | POA: Diagnosis not present

## 2015-05-31 DIAGNOSIS — S8991XA Unspecified injury of right lower leg, initial encounter: Secondary | ICD-10-CM | POA: Diagnosis present

## 2015-05-31 MED ORDER — KETOROLAC TROMETHAMINE 60 MG/2ML IM SOLN
60.0000 mg | Freq: Once | INTRAMUSCULAR | Status: AC
  Administered 2015-05-31: 60 mg via INTRAMUSCULAR
  Filled 2015-05-31: qty 2

## 2015-05-31 MED ORDER — OXYCODONE-ACETAMINOPHEN 7.5-325 MG PO TABS: 1.0000 | ORAL_TABLET | Freq: Four times a day (QID) | ORAL | Status: DC | PRN

## 2015-05-31 NOTE — ED Notes (Signed)
Pt states that he has chronic right knee pain that makes him fall all of the time.  Larey SeatFell today and now has laceration to knee.

## 2015-05-31 NOTE — Discharge Instructions (Signed)
Knee Pain °Knee pain can be a result of an injury or other medical conditions. Treatment will depend on the cause of your pain. °HOME CARE °· Only take medicine as told by your doctor. °· Keep a healthy weight. Being overweight can make the knee hurt more. °· Stretch before exercising or playing sports. °· If there is constant knee pain, change the way you exercise. Ask your doctor for advice. °· Make sure shoes fit well. Choose the right shoe for the sport or activity. °· Protect your knees. Wear kneepads if needed. °· Rest when you are tired. °GET HELP RIGHT AWAY IF:  °· Your knee pain does not stop. °· Your knee pain does not get better. °· Your knee joint feels hot to the touch. °· You have a fever. °MAKE SURE YOU:  °· Understand these instructions. °· Will watch this condition. °· Will get help right away if you are not doing well or get worse. °Document Released: 01/24/2009 Document Revised: 01/20/2012 Document Reviewed: 01/24/2009 °ExitCare® Patient Information ©2015 [employer]. This information is not intended to replace advice given to you by your health care provider. Make sure you discuss any questions you have with your health care provider. ° °

## 2015-06-02 ENCOUNTER — Encounter (HOSPITAL_COMMUNITY): Payer: Self-pay | Admitting: *Deleted

## 2015-06-02 ENCOUNTER — Emergency Department (HOSPITAL_COMMUNITY): Payer: BLUE CROSS/BLUE SHIELD

## 2015-06-02 ENCOUNTER — Emergency Department (HOSPITAL_COMMUNITY)
Admission: EM | Admit: 2015-06-02 | Discharge: 2015-06-02 | Disposition: A | Discharge status: Home / Self Care | Payer: BLUE CROSS/BLUE SHIELD | Attending: Emergency Medicine | Admitting: Emergency Medicine

## 2015-06-02 DIAGNOSIS — Y9289 Other specified places as the place of occurrence of the external cause: Secondary | ICD-10-CM | POA: Diagnosis not present

## 2015-06-02 DIAGNOSIS — Z720 Tobacco use: Secondary | ICD-10-CM | POA: Insufficient documentation

## 2015-06-02 DIAGNOSIS — Y9389 Activity, other specified: Secondary | ICD-10-CM | POA: Insufficient documentation

## 2015-06-02 DIAGNOSIS — Z8739 Personal history of other diseases of the musculoskeletal system and connective tissue: Secondary | ICD-10-CM | POA: Diagnosis not present

## 2015-06-02 DIAGNOSIS — S8991XA Unspecified injury of right lower leg, initial encounter: Secondary | ICD-10-CM

## 2015-06-02 DIAGNOSIS — F329 Major depressive disorder, single episode, unspecified: Secondary | ICD-10-CM | POA: Diagnosis not present

## 2015-06-02 DIAGNOSIS — G8929 Other chronic pain: Secondary | ICD-10-CM | POA: Diagnosis not present

## 2015-06-02 DIAGNOSIS — Y998 Other external cause status: Secondary | ICD-10-CM | POA: Insufficient documentation

## 2015-06-02 DIAGNOSIS — Z79899 Other long term (current) drug therapy: Secondary | ICD-10-CM | POA: Insufficient documentation

## 2015-06-02 DIAGNOSIS — Y288XXA Contact with other sharp object, undetermined intent, initial encounter: Secondary | ICD-10-CM | POA: Diagnosis not present

## 2015-06-02 DIAGNOSIS — F419 Anxiety disorder, unspecified: Secondary | ICD-10-CM | POA: Insufficient documentation

## 2015-06-02 DIAGNOSIS — S81011A Laceration without foreign body, right knee, initial encounter: Secondary | ICD-10-CM | POA: Diagnosis not present

## 2015-06-02 DIAGNOSIS — W19XXXA Unspecified fall, initial encounter: Secondary | ICD-10-CM

## 2015-06-02 DIAGNOSIS — M25561 Pain in right knee: Secondary | ICD-10-CM

## 2015-06-02 MED ORDER — OXYCODONE-ACETAMINOPHEN 5-325 MG PO TABS
1.0000 | ORAL_TABLET | Freq: Once | ORAL | Status: AC
  Administered 2015-06-02: 1 via ORAL
  Filled 2015-06-02: qty 1

## 2015-06-02 MED ORDER — OXYCODONE-ACETAMINOPHEN 7.5-325 MG PO TABS: 1.0000 | ORAL_TABLET | Freq: Four times a day (QID) | ORAL | Status: DC | PRN

## 2015-06-02 NOTE — Discharge Instructions (Signed)

## 2015-06-02 NOTE — ED Provider Notes (Signed)
CSN: [id number]     Arrival date & time 05/31/15  1447 History   First MD Initiated Contact with Patient 05/31/15 1518     Chief Complaint  Patient presents with  . Laceration  . Knee Injury     (Consider location/radiation/quality/duration/timing/severity/associated sxs/prior Treatment) HPI   Johnathan Lane is a 19 y.o. male with a hx of chronic right knee pain and [name] presents to the Emergency Department complaining of worsening right knee pain after a fall.  He states that his knee "gave away" causing him to fall and land on a metal strip on the floor.  He reports bleeding and a cut just below the kneecap.  He has not cleaned the wound prior to arrival or tried any therapies.  He reports pain with movement.  He denies swelling, weakness of the extremity, continued bleeding or pain proximal or distal to the knee.  He is scheduled to have a nerve block procedure at [facility] next week.     Past Medical History  Diagnosis Date  . Osgood-Schlatter's disease   . Depression   . Anxiety   . Patellar tendonitis   . Chronic knee pain    Past Surgical History  Procedure Laterality Date  . Wisdom tooth extraction     Family History  Problem Relation Age of Onset  . Diabetes Father   . ADD / ADHD Father   . Bipolar disorder Father   . Anxiety disorder Father   . Alcohol abuse Father   . Alcohol abuse Maternal Grandfather   . Anxiety disorder Maternal Grandfather   . Depression Maternal Grandfather   . Dementia Neg Hx   . Drug abuse Neg Hx   . OCD Neg Hx   . Paranoid behavior Neg Hx   . Schizophrenia Neg Hx   . Seizures Neg Hx   . Sexual abuse Neg Hx   . Physical abuse Neg Hx    History  Substance Use Topics  . Smoking status: Current Some Day Smoker    Types: Cigarettes  . Smokeless tobacco: Never Used  . Alcohol Use: No    Review of Systems  Constitutional: Negative for fever and chills.  Genitourinary: Negative for dysuria and difficulty urinating.   Musculoskeletal: Positive for arthralgias (right knee pain). Negative for joint swelling.  Skin: Positive for wound (abrasion right knee). Negative for color change.  All other systems reviewed and are negative.     Allergies  Risperdal and Indocin  Home Medications   Prior to Admission medications   Medication Sig Start Date End Date Taking? Authorizing Provider  busPIRone (BUSPAR) 15 MG tablet Take 15 mg by mouth 3 (three) times daily.   Yes Historical Provider, MD  diazepam (VALIUM) 5 MG tablet Take 5 mg by mouth 3 (three) times daily.   Yes Historical Provider, MD  DULoxetine (CYMBALTA) 30 MG capsule Take 30 mg by mouth daily. Takes with 60 mg capsule to =90 mg   Yes Historical Provider, MD  DULoxetine (CYMBALTA) 60 MG capsule Take 60 mg by mouth daily. Takes with 30 mg capsule to =90 mg   Yes Historical Provider, MD  Suvorexant (BELSOMRA) 15 MG TABS Take 15 mg by mouth at bedtime.   Yes Historical Provider, MD  Melatonin 5 MG TABS Take 1 tablet by mouth at bedtime as needed (sleep).    Historical Provider, MD  oxyCODONE-acetaminophen (PERCOCET) 7.5-325 MG per tablet Take 1 tablet by mouth every 6 (six) hours as needed for severe pain.  06/02/15   Tiffany Neva Seat, PA-C   BP 113/69 mmHg  Pulse 52  Temp(Src) 97.8 F (36.6 C) (Oral)  Resp 16  Ht 6' (1.829 m)  Wt 160 lb (72.576 kg)  BMI 21.70 kg/m2  SpO2 98% Physical Exam  Constitutional: He is oriented to person, place, and time. He appears well-developed and well-nourished. No distress.  Cardiovascular: Normal rate, regular rhythm, normal heart sounds and intact distal pulses.   Pulmonary/Chest: Effort normal and breath sounds normal.  Musculoskeletal: He exhibits tenderness. He exhibits no edema.  Very superficial abrasion to the right knee just distal to the patella.  Diffuse ttp of the anterior right knee.  No erythema, effusion, or step-off deformity. Patellar tendon appears intact on exam. DP pulse brisk, distal sensation  intact. Calf is soft and NT. No bleeding   Neurological: He is alert and oriented to person, place, and time. He exhibits normal muscle tone. Coordination normal.  Skin: Skin is warm and dry. No erythema.  Nursing note and vitals reviewed.   ED Course  Procedures (including critical care time) Labs Review Labs Reviewed - No data to display  Imaging Review Dg Knee Complete 4 Views Right  06/02/2015   CLINICAL DATA:  Status post fall today while using crutches with a blow and laceration of the right knee. Subsequent encounter.  EXAM: RIGHT KNEE - COMPLETE 4+ VIEW  COMPARISON:  Plain films right knee 05/31/2015 and MRI right knee 05/16/2015.  FINDINGS: There is no evidence of fracture, dislocation, or joint effusion. There is no evidence of arthropathy or other focal bone abnormality. Soft tissues are unremarkable. No radiopaque foreign body is identified.  IMPRESSION: Negative exam.   Electronically Signed   By: Drusilla Kanner M.D.   On: 06/02/2015 11:39     EKG Interpretation None      MDM   Final diagnoses:  Abrasion, knee, right, initial encounter  Right knee pain   Abrasion to the knee was cleaned by me using saline.  Bleeding controlled.  No indication for wound closure.  Dressing applied.    Pt has been here multiple times for chronic knee pain.  Had essentially nml appearing MRI of the knee 05/17/15  Pt with chronic right knee pain, sustained worsening pain after a fall.  XR s neg.  He has a very superficial abrasion over the patella tendon.  He appears stable for d/c and agrees to arrange orthopedic or PMD f/u     Pauline Aus, PA-C 06/02/15 1702  Vanetta Mulders, MD 06/14/15 [phone]

## 2015-06-02 NOTE — ED Provider Notes (Signed)
CSN: [id number]     Arrival date & time 06/02/15  1042 History   First MD Initiated Contact with Patient 06/02/15 1048     Chief Complaint  Patient presents with  . Knee Injury     (Consider location/radiation/quality/duration/timing/severity/associated sxs/prior Treatment) HPI   19 year old male with history of anxiety, panic disorder, Osgood-Schlatter, patella tendinitis, cigarette smoker presents with recurrent right anterior knee pain worse with light palpation and any movement after a fall this morning when his crutches gave out. He uses crutches for his chronic knee pain and goes to [facility] for a nerve injection into his back this upcoming week for further evaluation. He sustained an abrasion to the area over the patellar tendon. The incident happened around 5 am this morning.  . Patient has no back pain, no back surgeries, no weakness in his legs, no bowel or bladder changes. No fevers or chills. No head of neck injuries. No loc.   Past Medical History  Diagnosis Date  . Osgood-Schlatter's disease   . Depression   . Anxiety   . Patellar tendonitis   . Chronic knee pain    Past Surgical History  Procedure Laterality Date  . Wisdom tooth extraction     Family History  Problem Relation Age of Onset  . Diabetes Father   . ADD / ADHD Father   . Bipolar disorder Father   . Anxiety disorder Father   . Alcohol abuse Father   . Alcohol abuse Maternal Grandfather   . Anxiety disorder Maternal Grandfather   . Depression Maternal Grandfather   . Dementia Neg Hx   . Drug abuse Neg Hx   . OCD Neg Hx   . Paranoid behavior Neg Hx   . Schizophrenia Neg Hx   . Seizures Neg Hx   . Sexual abuse Neg Hx   . Physical abuse Neg Hx    History  Substance Use Topics  . Smoking status: Current Some Day Smoker    Types: Cigarettes  . Smokeless tobacco: Never Used  . Alcohol Use: No    Review of Systems  10 Systems reviewed and are negative for acute change except as noted in the HPI.    Allergies  Risperdal and Indocin  Home Medications   Prior to Admission medications   Medication Sig Start Date End Date Taking? Authorizing Provider  busPIRone (BUSPAR) 15 MG tablet Take 15 mg by mouth 3 (three) times daily.   Yes Historical Provider, MD  diazepam (VALIUM) 5 MG tablet Take 5 mg by mouth 3 (three) times daily.   Yes Historical Provider, MD  DULoxetine (CYMBALTA) 30 MG capsule Take 30 mg by mouth daily. Takes with 60 mg capsule to =90 mg   Yes Historical Provider, MD  DULoxetine (CYMBALTA) 60 MG capsule Take 60 mg by mouth daily. Takes with 30 mg capsule to =90 mg   Yes Historical Provider, MD  Melatonin 5 MG TABS Take 1 tablet by mouth at bedtime as needed (sleep).   Yes Historical Provider, MD  Suvorexant (BELSOMRA) 15 MG TABS Take 15 mg by mouth at bedtime.   Yes Historical Provider, MD  oxyCODONE-acetaminophen (PERCOCET) 7.5-325 MG per tablet Take 1 tablet by mouth every 6 (six) hours as needed for severe pain. 06/02/15   Johnathan Harries Neva Seat, PA-C   BP 106/71 mmHg  Pulse 89  Temp(Src) 98.5 F (36.9 C) (Oral)  Resp 16  Ht 6' (1.829 m)  Wt 160 lb (72.576 kg)  BMI 21.70 kg/m2  SpO2 100% Physical  Exam  Constitutional: He appears well-developed and well-nourished. No distress.  HENT:  Head: Normocephalic and atraumatic.  Eyes: Pupils are equal, round, and reactive to light.  Neck: Normal range of motion. Neck supple.  Cardiovascular: Normal rate and regular rhythm.   Pulmonary/Chest: Effort normal.  Abdominal: Soft.  Musculoskeletal:       Right knee: He exhibits laceration (superficial laceration that does not extend through the epidermis.Marland Kitchen small amount of bleeding continues from a pinpoint area on his knee). He exhibits normal range of motion, no swelling, no effusion and no ecchymosis. Tenderness found. Patellar tendon tenderness noted.  Pt is noticeably uncomfortable.  Neurological: He is alert.  Skin: Skin is warm and dry.  Nursing note and vitals  reviewed.   ED Course  Procedures (including critical care time) Labs Review Labs Reviewed - No data to display  Imaging Review Dg Knee Complete 4 Views Right  06/02/2015   CLINICAL DATA:  Status post fall today while using crutches with a blow and laceration of the right knee. Subsequent encounter.  EXAM: RIGHT KNEE - COMPLETE 4+ VIEW  COMPARISON:  Plain films right knee 05/31/2015 and MRI right knee 05/16/2015.  FINDINGS: There is no evidence of fracture, dislocation, or joint effusion. There is no evidence of arthropathy or other focal bone abnormality. Soft tissues are unremarkable. No radiopaque foreign body is identified.  IMPRESSION: Negative exam.   Electronically Signed   By: Drusilla Kanner M.D.   On: 06/02/2015 11:39   Dg Knee Complete 4 Views Right  05/31/2015   CLINICAL DATA:  Pain and laceration to apex of patella following fall  EXAM: RIGHT KNEE - COMPLETE 4+ VIEW  COMPARISON:  May 16, 2015 right knee radiographs and MRI examinations  FINDINGS: Frontal, lateral, and bilateral oblique views were obtained. There is no fracture or dislocation. Joint spaces appear intact. No radiopaque foreign body. No soft tissue air. No erosive change.  IMPRESSION: No abnormality noted.   Electronically Signed   By: Bretta Bang III M.D.   On: 05/31/2015 15:28     EKG Interpretation None      MDM   Final diagnoses:  Chronic knee pain, right  Fall, initial encounter  Knee injury, right, initial encounter    Pt given pain medication in the ED. PO Percocet given. Wound cleaned and Dermabond applied over the small area of bleeding. This controlled the bleeding well.  Rx: a few days of pain medication- mom advised to be sure he goes to his appointment at [facility] this upcoming week for his nerve block.  Medications  oxyCODONE-acetaminophen (PERCOCET/ROXICET) 5-325 MG per tablet 1 tablet (not administered)    18 y.o.Johnathan Lane evaluation in the Emergency Department is  complete. It has been determined that no acute conditions requiring further emergency intervention are present at this time. The patient/guardian have been advised of the diagnosis and plan. We have discussed signs and symptoms that warrant return to the ED, such as changes or worsening in symptoms.  Vital signs are stable at discharge. Filed Vitals:   06/02/15 1051  BP: 106/71  Pulse: 89  Temp: 98.5 F (36.9 C)  Resp: 16    Patient/guardian has voiced understanding and agreed to follow-up with the PCP or specialist.     Marlon Pel, PA-C 06/02/15 1205  Gilda Crease, MD 06/02/15 1739

## 2015-06-02 NOTE — ED Notes (Signed)
Pt states he re injured the right knee after falling on the frame of the frame at 0500 this morning. NAD

## 2015-06-14 ENCOUNTER — Encounter (HOSPITAL_COMMUNITY): Payer: Self-pay | Admitting: Emergency Medicine

## 2015-06-14 ENCOUNTER — Emergency Department (HOSPITAL_COMMUNITY)
Admission: EM | Admit: 2015-06-14 | Discharge: 2015-06-14 | Disposition: A | Discharge status: Home / Self Care | Payer: BLUE CROSS/BLUE SHIELD | Attending: Emergency Medicine | Admitting: Emergency Medicine

## 2015-06-14 ENCOUNTER — Emergency Department (HOSPITAL_COMMUNITY): Payer: BLUE CROSS/BLUE SHIELD

## 2015-06-14 DIAGNOSIS — Z79899 Other long term (current) drug therapy: Secondary | ICD-10-CM | POA: Insufficient documentation

## 2015-06-14 DIAGNOSIS — M25561 Pain in right knee: Secondary | ICD-10-CM | POA: Insufficient documentation

## 2015-06-14 DIAGNOSIS — F419 Anxiety disorder, unspecified: Secondary | ICD-10-CM | POA: Insufficient documentation

## 2015-06-14 DIAGNOSIS — F329 Major depressive disorder, single episode, unspecified: Secondary | ICD-10-CM | POA: Insufficient documentation

## 2015-06-14 DIAGNOSIS — Z720 Tobacco use: Secondary | ICD-10-CM | POA: Diagnosis not present

## 2015-06-14 DIAGNOSIS — G8929 Other chronic pain: Secondary | ICD-10-CM | POA: Diagnosis not present

## 2015-06-14 MED ORDER — OXYCODONE-ACETAMINOPHEN 2.5-325 MG PO TABS: 1.0000 | ORAL_TABLET | ORAL | Status: DC | PRN

## 2015-06-14 MED ORDER — OXYCODONE-ACETAMINOPHEN 5-325 MG PO TABS
2.0000 | ORAL_TABLET | Freq: Once | ORAL | Status: AC
  Administered 2015-06-14: 2 via ORAL
  Filled 2015-06-14: qty 2

## 2015-06-14 MED ORDER — KETOROLAC TROMETHAMINE 60 MG/2ML IM SOLN
60.0000 mg | Freq: Once | INTRAMUSCULAR | Status: AC
  Administered 2015-06-14: 60 mg via INTRAMUSCULAR
  Filled 2015-06-14: qty 2

## 2015-06-14 NOTE — ED Notes (Signed)
Pain at distal edge of patella radiating around to medial edge.  Very tender to light palpation. Slight swelling noted at proximal medial patellar edge.  Patient states he feels a "popping" w/knee extension and flexion. No crepitus palpated.

## 2015-06-14 NOTE — ED Notes (Signed)
Patient with no complaints at this time. Respirations even and unlabored. Skin warm/dry. Discharge instructions reviewed with patient at this time. Patient given opportunity to voice concerns/ask questions. Patient discharged at this time and left Emergency Department with steady gait.   

## 2015-06-14 NOTE — Discharge Instructions (Signed)

## 2015-06-14 NOTE — ED Provider Notes (Signed)
CSN: [id number]     Arrival date & time 06/14/15  1553 History   First MD Initiated Contact with Patient 06/14/15 1640     Chief Complaint  Patient presents with  . Knee Pain     (Consider location/radiation/quality/duration/timing/severity/associated sxs/prior Treatment) The history is provided by the patient and a parent.   Johnathan Lane is a 19 y.o. male with a  History of chronic knee and low back pain for which he is currently receiving treatment at [facility] including having a steroid injection in his lumbar spine this past week and is anticipating starting steroid cream with heat application with physical therapy this coming week for treatment of inflammation of his patellar "structures"  (recent MRI revealed medial plica inflammation).  His symptoms originally started with diagnosis of [name] disease last year several years ago.  This morning he was walking when he heard a loud popping sensation (mother endorses she heard it from across the room) in his medial right knee which is worse than normal, the knee felt like weak like it would buckle, but the weakness has improved.  He has tried taking his oxycodone 5 mg tablet which has not improved this new increased pain.    Past Medical History  Diagnosis Date  . Osgood-Schlatter's disease   . Depression   . Anxiety   . Patellar tendonitis   . Chronic knee pain    Past Surgical History  Procedure Laterality Date  . Wisdom tooth extraction     Family History  Problem Relation Age of Onset  . Diabetes Father   . ADD / ADHD Father   . Bipolar disorder Father   . Anxiety disorder Father   . Alcohol abuse Father   . Alcohol abuse Maternal Grandfather   . Anxiety disorder Maternal Grandfather   . Depression Maternal Grandfather   . Dementia Neg Hx   . Drug abuse Neg Hx   . OCD Neg Hx   . Paranoid behavior Neg Hx   . Schizophrenia Neg Hx   . Seizures Neg Hx   . Sexual abuse Neg Hx   . Physical abuse Neg Hx     History  Substance Use Topics  . Smoking status: Current Some Day Smoker    Types: Cigarettes  . Smokeless tobacco: Never Used  . Alcohol Use: No    Review of Systems  Constitutional: Negative for fever.  Musculoskeletal: Positive for joint swelling and arthralgias. Negative for myalgias.  Neurological: Negative for weakness and numbness.      Allergies  Risperdal and Indocin  Home Medications   Prior to Admission medications   Medication Sig Start Date End Date Taking? Authorizing Provider  busPIRone (BUSPAR) 15 MG tablet Take 15 mg by mouth 3 (three) times daily.    Historical Provider, MD  diazepam (VALIUM) 5 MG tablet Take 5 mg by mouth 3 (three) times daily.    Historical Provider, MD  DULoxetine (CYMBALTA) 30 MG capsule Take 30 mg by mouth daily. Takes with 60 mg capsule to =90 mg    Historical Provider, MD  DULoxetine (CYMBALTA) 60 MG capsule Take 60 mg by mouth daily. Takes with 30 mg capsule to =90 mg    Historical Provider, MD  Melatonin 5 MG TABS Take 1 tablet by mouth at bedtime as needed (sleep).    Historical Provider, MD  oxycodone-acetaminophen (PERCOCET) 2.5-325 MG per tablet Take 1 tablet by mouth every 4 (four) hours as needed for pain. 06/14/15   Burgess Amor,  PA-C  Suvorexant (BELSOMRA) 15 MG TABS Take 15 mg by mouth at bedtime.    Historical Provider, MD   BP 101/48 mmHg  Pulse 97  Temp(Src) 98.2 F (36.8 C) (Oral)  Resp 18  Ht 6' (1.829 m)  Wt 160 lb (72.576 kg)  BMI 21.70 kg/m2  SpO2 100% Physical Exam  Constitutional: He appears well-developed and well-nourished.  HENT:  Head: Atraumatic.  Neck: Normal range of motion.  Cardiovascular:  Pulses equal bilaterally  Musculoskeletal: He exhibits edema and tenderness.       Right knee: He exhibits swelling. He exhibits no effusion, normal alignment, no LCL laxity and no MCL laxity. Tenderness found. Medial joint line tenderness noted.  Mild swelling medial upper patellar space,  ttp along medial  patella.  Crepitus with knee extension.  Neurological: He is alert. He has normal strength. He displays normal reflexes. No sensory deficit.  Skin: Skin is warm and dry.  Psychiatric: He has a normal mood and affect.    ED Course  Procedures (including critical care time) Labs Review Labs Reviewed - No data to display  Imaging Review Dg Knee Complete 4 Views Left  06/14/2015   CLINICAL DATA:  Patient with popping sensation in the right knee. Associated pain. History of Osgood-Schlatter's disease. Initial encounter.  EXAM: LEFT KNEE - COMPLETE 4+ VIEW  COMPARISON:  None.  FINDINGS: There is no evidence of fracture, dislocation, or joint effusion. There is no evidence of arthropathy or other focal bone abnormality. Soft tissues are unremarkable.  IMPRESSION: No acute osseous abnormality.   Electronically Signed   By: Annia Belt M.D.   On: 06/14/2015 17:26     EKG Interpretation None      MDM   Final diagnoses:  Chronic knee pain, right    Patients labs and/or radiological studies were reviewed and considered during the medical decision making and disposition process.   Imaging was reviewed, interpreted and I agree with radiologists reading.  Results were also discussed with patient. Pt's prior visits here reviewed.  He has had multiple prior x-rays, but states today's sudden popping sensation is much different then prior problems and is concerned about new injury, therefore repeated x-rays today.  He was placed in an ace wrap which he says did improve symptoms somewhat.  He was prescribed oxycodone 2.5 mg strength tablets to take in addition to his 5 mg tablets for increased pain relief until he can see his physician next Tuesday as planned.  The patient appears reasonably screened and/or stabilized for discharge and I doubt any other medical condition or other [facility] requiring further screening, evaluation, or treatment in the ED at this time prior to discharge.\    Burgess Amor,  PA-C 06/14/15 1810  Glynn Octave, MD 06/14/15 [phone]

## 2015-06-14 NOTE — ED Notes (Signed)
Pt reports right knee pain since walking today and hearing a "pop". nad noted. No deformity noted.

## 2015-06-19 ENCOUNTER — Encounter (HOSPITAL_COMMUNITY): Payer: Self-pay | Admitting: Emergency Medicine

## 2015-06-19 ENCOUNTER — Emergency Department (HOSPITAL_COMMUNITY): Payer: BLUE CROSS/BLUE SHIELD

## 2015-06-19 ENCOUNTER — Emergency Department (HOSPITAL_COMMUNITY)
Admission: EM | Admit: 2015-06-19 | Discharge: 2015-06-19 | Disposition: A | Discharge status: Home / Self Care | Payer: BLUE CROSS/BLUE SHIELD | Attending: Physician Assistant | Admitting: Physician Assistant

## 2015-06-19 DIAGNOSIS — Z79899 Other long term (current) drug therapy: Secondary | ICD-10-CM | POA: Diagnosis not present

## 2015-06-19 DIAGNOSIS — Z720 Tobacco use: Secondary | ICD-10-CM | POA: Diagnosis not present

## 2015-06-19 DIAGNOSIS — F419 Anxiety disorder, unspecified: Secondary | ICD-10-CM | POA: Diagnosis not present

## 2015-06-19 DIAGNOSIS — F329 Major depressive disorder, single episode, unspecified: Secondary | ICD-10-CM | POA: Diagnosis not present

## 2015-06-19 DIAGNOSIS — G8921 Chronic pain due to trauma: Secondary | ICD-10-CM | POA: Insufficient documentation

## 2015-06-19 DIAGNOSIS — M25561 Pain in right knee: Secondary | ICD-10-CM | POA: Insufficient documentation

## 2015-06-19 MED ORDER — MORPHINE SULFATE 2 MG/ML IJ SOLN
2.0000 mg | Freq: Once | INTRAMUSCULAR | Status: AC
  Administered 2015-06-19: 2 mg via INTRAMUSCULAR
  Filled 2015-06-19: qty 1

## 2015-06-19 MED ORDER — KETOROLAC TROMETHAMINE 30 MG/ML IJ SOLN
30.0000 mg | Freq: Once | INTRAMUSCULAR | Status: AC
  Administered 2015-06-19: 30 mg via INTRAMUSCULAR
  Filled 2015-06-19: qty 1

## 2015-06-19 NOTE — ED Notes (Signed)
Pt reports fell on Friday and reports right knee pain ever since. Pt also reports fell yesterday and was hit by a bookshelf. nad noted. Pt alert and oriented. Pt denies loc.

## 2015-06-19 NOTE — Discharge Instructions (Signed)
Keep your appointment tomorrow as scheduled at [facility].

## 2015-06-19 NOTE — ED Provider Notes (Signed)
CSN: [id number]     Arrival date & time 06/19/15  1006 History   First MD Initiated Contact with Patient 06/19/15 1024     Chief Complaint  Patient presents with  . Knee Pain     (Consider location/radiation/quality/duration/timing/severity/associated sxs/prior Treatment) HPI Johnathan Lane is a 19 y.o. male with a hx of chronic knee pain. He was diagnosed with [name] disease several years ago and has had problems since that time. He is followed at [facility] for his pain and also for PT. He had a recent MRI that revealed Medial Plica inflammation. He was evaluated here after a fall 06/14/15 and had x-rays at that time. Today he presents to the ED with right knee pain that has continued after he fell 3 days ago. In addition, he reports falling again yesterday and  a bookshelf hit his knee.   Past Medical History  Diagnosis Date  . Osgood-Schlatter's disease   . Depression   . Anxiety   . Patellar tendonitis   . Chronic knee pain    Past Surgical History  Procedure Laterality Date  . Wisdom tooth extraction     Family History  Problem Relation Age of Onset  . Diabetes Father   . ADD / ADHD Father   . Bipolar disorder Father   . Anxiety disorder Father   . Alcohol abuse Father   . Alcohol abuse Maternal Grandfather   . Anxiety disorder Maternal Grandfather   . Depression Maternal Grandfather   . Dementia Neg Hx   . Drug abuse Neg Hx   . OCD Neg Hx   . Paranoid behavior Neg Hx   . Schizophrenia Neg Hx   . Seizures Neg Hx   . Sexual abuse Neg Hx   . Physical abuse Neg Hx    History  Substance Use Topics  . Smoking status: Current Some Day Smoker    Types: Cigarettes  . Smokeless tobacco: Never Used  . Alcohol Use: No    Review of Systems  Musculoskeletal:       Right knee pain  all other systems negative    Allergies  Risperdal and Indocin  Home Medications   Prior to Admission medications   Medication Sig Start Date End Date Taking? Authorizing  Provider  busPIRone (BUSPAR) 15 MG tablet Take 15 mg by mouth 3 (three) times daily.   Yes Historical Provider, MD  diazepam (VALIUM) 5 MG tablet Take 5 mg by mouth 3 (three) times daily.   Yes Historical Provider, MD  DULoxetine (CYMBALTA) 30 MG capsule Take 30 mg by mouth daily. Takes with 60 mg capsule to =90 mg   Yes Historical Provider, MD  DULoxetine (CYMBALTA) 60 MG capsule Take 60 mg by mouth daily. Takes with 30 mg capsule to =90 mg   Yes Historical Provider, MD  ibuprofen (ADVIL,MOTRIN) 200 MG tablet Take 800 mg by mouth every 6 (six) hours as needed for moderate pain.   Yes Historical Provider, MD  Melatonin 5 MG TABS Take 1 tablet by mouth at bedtime as needed (sleep).   Yes Historical Provider, MD  oxymetazoline (AFRIN) 0.05 % nasal spray Place 1 spray into both nostrils daily as needed for congestion.   Yes Historical Provider, MD  Suvorexant (BELSOMRA) 15 MG TABS Take 15 mg by mouth at bedtime.   Yes Historical Provider, MD  oxycodone-acetaminophen (PERCOCET) 2.5-325 MG per tablet Take 1 tablet by mouth every 4 (four) hours as needed for pain. Patient not taking: Reported on 06/19/2015  06/14/15   Burgess Amor, PA-C   BP 110/66 mmHg  Pulse 84  Temp(Src) 98.2 F (36.8 C) (Oral)  Resp 16  Ht 6' (1.829 m)  Wt 160 lb (72.576 kg)  BMI 21.70 kg/m2  SpO2 99% Physical Exam  Constitutional: He is oriented to person, place, and time. He appears well-developed and well-nourished. No distress.  HENT:  Head: Normocephalic.  Right Ear: Tympanic membrane normal.  Left Ear: Tympanic membrane normal.  Nose: Nose normal.  Eyes: Conjunctivae and EOM are normal. Pupils are equal, round, and reactive to light.  Neck: Normal range of motion. Neck supple.  Cardiovascular: Normal rate.   Pulmonary/Chest: Effort normal.  Musculoskeletal:       Right knee: He exhibits normal range of motion, no erythema, normal alignment and normal patellar mobility. Swelling: minimal. Lacerations: abrasions from  previous fall. Tenderness found. Patellar tendon tenderness noted.  Pedal pulses 2+ bilateral, adequate circulation, good touch sensation, equal strength. Full passive range of motion with some pain.   Neurological: He is alert and oriented to person, place, and time. No cranial nerve deficit.  Skin: Skin is warm and dry.  Psychiatric: He has a normal mood and affect. His behavior is normal.  Nursing note and vitals reviewed.   ED Course  Procedures (including critical care time)  Dr. Corlis Leak in to examine the patient and discuss pain management and need for follow up with ortho at [facility] for the management. Will give Morphine 2 mg. IM and Toradol IM now. No Rx to go home with patient.  Labs Review Labs Reviewed - No data to display  Imaging Review Dg Knee Complete 4 Views Right  06/19/2015   CLINICAL DATA:  Right knee pain secondary to falls 3 days ago and 2 days ago and to blunt trauma when a bookshelf fell on the knee today.  EXAM: RIGHT KNEE - COMPLETE 4+ VIEW  COMPARISON:  06/02/2015  FINDINGS: There is no evidence of fracture, dislocation, or joint effusion. There is no evidence of arthropathy or other focal bone abnormality. Soft tissues are unremarkable.  IMPRESSION: Normal exam.   Electronically Signed   By: Francene Boyers M.D.   On: 06/19/2015 11:16    MDM  19 y.o. male with chronic right knee pain. Stable for d/c without acute findings on x-ray and has follow up scheduled for tomorrow at [facility].   Final diagnoses:  Right knee pain      Janne Napoleon, NP 06/19/15 1231  Courteney Randall An, MD 06/19/15 [phone]

## 2015-06-19 NOTE — ED Notes (Signed)
Patient request something for pain. MD made aware. No new orders given.

## 2015-06-20 ENCOUNTER — Emergency Department (HOSPITAL_COMMUNITY)
Admission: EM | Admit: 2015-06-20 | Discharge: 2015-06-20 | Disposition: A | Discharge status: Home / Self Care | Payer: BLUE CROSS/BLUE SHIELD | Attending: Emergency Medicine | Admitting: Emergency Medicine

## 2015-06-20 ENCOUNTER — Encounter (HOSPITAL_COMMUNITY): Payer: Self-pay | Admitting: Emergency Medicine

## 2015-06-20 DIAGNOSIS — F329 Major depressive disorder, single episode, unspecified: Secondary | ICD-10-CM | POA: Diagnosis not present

## 2015-06-20 DIAGNOSIS — G8929 Other chronic pain: Secondary | ICD-10-CM | POA: Diagnosis not present

## 2015-06-20 DIAGNOSIS — Z79899 Other long term (current) drug therapy: Secondary | ICD-10-CM | POA: Diagnosis not present

## 2015-06-20 DIAGNOSIS — L089 Local infection of the skin and subcutaneous tissue, unspecified: Secondary | ICD-10-CM | POA: Insufficient documentation

## 2015-06-20 DIAGNOSIS — Z8739 Personal history of other diseases of the musculoskeletal system and connective tissue: Secondary | ICD-10-CM | POA: Insufficient documentation

## 2015-06-20 DIAGNOSIS — Z720 Tobacco use: Secondary | ICD-10-CM | POA: Insufficient documentation

## 2015-06-20 DIAGNOSIS — R21 Rash and other nonspecific skin eruption: Secondary | ICD-10-CM | POA: Diagnosis present

## 2015-06-20 DIAGNOSIS — F419 Anxiety disorder, unspecified: Secondary | ICD-10-CM | POA: Insufficient documentation

## 2015-06-20 MED ORDER — CEPHALEXIN 500 MG PO CAPS: 500.0000 mg | ORAL_CAPSULE | Freq: Four times a day (QID) | ORAL | Status: AC

## 2015-06-20 MED ORDER — HYDROCODONE-ACETAMINOPHEN 5-325 MG PO TABS
1.0000 | ORAL_TABLET | Freq: Once | ORAL | Status: AC
  Administered 2015-06-20: 1 via ORAL
  Filled 2015-06-20: qty 1

## 2015-06-20 NOTE — ED Notes (Signed)
Pts mother states that he woke up this morning with a rash and a knot on right shin area.

## 2015-06-20 NOTE — ED Notes (Signed)
Pt seen and evaluated by EDpa for initial assessment. 

## 2015-06-20 NOTE — Discharge Instructions (Signed)
Follow up with your doctor for worsening symptoms Rash A rash is a change in the color or feel of your skin. There are many different types of rashes. You may have other problems along with your rash. HOME CARE  Avoid the thing that caused your rash.  Do not scratch your rash.  You may take cools baths to help stop itching.  Only take medicines as told by your doctor.  Keep all doctor visits as told. GET HELP RIGHT AWAY IF:   Your pain, puffiness (swelling), or redness gets worse.  You have a fever.  You have new or severe problems.  You have body aches, watery poop (diarrhea), or you throw up (vomit).  Your rash is not better after 3 days. MAKE SURE YOU:   Understand these instructions.  Will watch your condition.  Will get help right away if you are not doing well or get worse. Document Released: 04/15/2008 Document Revised: 01/20/2012 Document Reviewed: 08/12/2011 [facility] Patient Information 2015 [place], [place]. This information is not intended to replace advice given to you by your health care provider. Make sure you discuss any questions you have with your health care provider.

## 2015-06-20 NOTE — ED Provider Notes (Signed)
CSN: [id number]     Arrival date & time 06/20/15  1633 History   First MD Initiated Contact with Patient 06/20/15 1653     Chief Complaint  Patient presents with  . Rash     (Consider location/radiation/quality/duration/timing/severity/associated sxs/prior Treatment) HPI Comments: Johnathan Lane is a 19 y.o. male with a hx of chronic knee pain. He was diagnosed with [name] disease several years ago and has had problems since that time. He is followed at [facility] for his pain and also for PT. He was at physical therapy today and the therapist knotted the had some redness to the shin. Mother states that wasn't there yesterday. He fell a couple of days ago and had some abrasions to his right knee. But the other stuff started today  The history is provided by the patient and a parent.    Past Medical History  Diagnosis Date  . Osgood-Schlatter's disease   . Depression   . Anxiety   . Patellar tendonitis   . Chronic knee pain    Past Surgical History  Procedure Laterality Date  . Wisdom tooth extraction     Family History  Problem Relation Age of Onset  . Diabetes Father   . ADD / ADHD Father   . Bipolar disorder Father   . Anxiety disorder Father   . Alcohol abuse Father   . Alcohol abuse Maternal Grandfather   . Anxiety disorder Maternal Grandfather   . Depression Maternal Grandfather   . Dementia Neg Hx   . Drug abuse Neg Hx   . OCD Neg Hx   . Paranoid behavior Neg Hx   . Schizophrenia Neg Hx   . Seizures Neg Hx   . Sexual abuse Neg Hx   . Physical abuse Neg Hx    History  Substance Use Topics  . Smoking status: Current Some Day Smoker    Types: Cigarettes  . Smokeless tobacco: Never Used  . Alcohol Use: No    Review of Systems  All other systems reviewed and are negative.     Allergies  Risperdal and Indocin  Home Medications   Prior to Admission medications   Medication Sig Start Date End Date Taking? Authorizing Provider  busPIRone  (BUSPAR) 15 MG tablet Take 15 mg by mouth 3 (three) times daily.    Historical Provider, MD  diazepam (VALIUM) 5 MG tablet Take 5 mg by mouth 3 (three) times daily.    Historical Provider, MD  DULoxetine (CYMBALTA) 30 MG capsule Take 30 mg by mouth daily. Takes with 60 mg capsule to =90 mg    Historical Provider, MD  DULoxetine (CYMBALTA) 60 MG capsule Take 60 mg by mouth daily. Takes with 30 mg capsule to =90 mg    Historical Provider, MD  ibuprofen (ADVIL,MOTRIN) 200 MG tablet Take 800 mg by mouth every 6 (six) hours as needed for moderate pain.    Historical Provider, MD  Melatonin 5 MG TABS Take 1 tablet by mouth at bedtime as needed (sleep).    Historical Provider, MD  oxycodone-acetaminophen (PERCOCET) 2.5-325 MG per tablet Take 1 tablet by mouth every 4 (four) hours as needed for pain. Patient not taking: Reported on 06/19/2015 06/14/15   Burgess Amor, PA-C  oxymetazoline (AFRIN) 0.05 % nasal spray Place 1 spray into both nostrils daily as needed for congestion.    Historical Provider, MD  Suvorexant (BELSOMRA) 15 MG TABS Take 15 mg by mouth at bedtime.    Historical Provider, MD  BP 119/63 mmHg  Pulse 80  Temp(Src) 97.8 F (36.6 C) (Oral)  Resp 18  Ht 6' (1.829 m)  Wt 160 lb (72.576 kg)  BMI 21.70 kg/m2  SpO2 100% Physical Exam  Constitutional: He is oriented to person, place, and time. He appears well-developed and well-nourished.  Cardiovascular: Normal rate and regular rhythm.   Pulmonary/Chest: Effort normal and breath sounds normal.  Musculoskeletal: Normal range of motion.  Neurological: He is alert and oriented to person, place, and time.  Skin:  Pt has a localized area of redness noted to the right shin.no drainage noted. No drainage noted to the area. Pulses intact  Nursing note and vitals reviewed.   ED Course  Procedures (including critical care time) Labs Review Labs Reviewed - No data to display  Imaging Review Dg Knee Complete 4 Views Right  06/19/2015   CLINICAL  DATA:  Right knee pain secondary to falls 3 days ago and 2 days ago and to blunt trauma when a bookshelf fell on the knee today.  EXAM: RIGHT KNEE - COMPLETE 4+ VIEW  COMPARISON:  06/02/2015  FINDINGS: There is no evidence of fracture, dislocation, or joint effusion. There is no evidence of arthropathy or other focal bone abnormality. Soft tissues are unremarkable.  IMPRESSION: Normal exam.   Electronically Signed   By: Francene Boyers M.D.   On: 06/19/2015 11:16     EKG Interpretation None      MDM   Final diagnoses:  Skin infection    Consistent with localized skin infection. Discussed return precautions. Pt given pain medication here and told that will not give any for home    Teressa Lower, NP 06/20/15 1704  Linwood Dibbles, MD 06/20/15 1911

## 2015-07-19 ENCOUNTER — Encounter (HOSPITAL_COMMUNITY): Payer: Self-pay | Admitting: Emergency Medicine

## 2015-07-19 ENCOUNTER — Emergency Department (HOSPITAL_COMMUNITY): Payer: BLUE CROSS/BLUE SHIELD

## 2015-07-19 ENCOUNTER — Emergency Department (HOSPITAL_COMMUNITY)
Admission: EM | Admit: 2015-07-19 | Discharge: 2015-07-19 | Disposition: A | Discharge status: Home / Self Care | Payer: BLUE CROSS/BLUE SHIELD | Attending: Emergency Medicine | Admitting: Emergency Medicine

## 2015-07-19 DIAGNOSIS — Z8739 Personal history of other diseases of the musculoskeletal system and connective tissue: Secondary | ICD-10-CM | POA: Insufficient documentation

## 2015-07-19 DIAGNOSIS — Y939 Activity, unspecified: Secondary | ICD-10-CM | POA: Insufficient documentation

## 2015-07-19 DIAGNOSIS — G8929 Other chronic pain: Secondary | ICD-10-CM | POA: Diagnosis not present

## 2015-07-19 DIAGNOSIS — Z79899 Other long term (current) drug therapy: Secondary | ICD-10-CM | POA: Diagnosis not present

## 2015-07-19 DIAGNOSIS — W01198A Fall on same level from slipping, tripping and stumbling with subsequent striking against other object, initial encounter: Secondary | ICD-10-CM | POA: Insufficient documentation

## 2015-07-19 DIAGNOSIS — Z720 Tobacco use: Secondary | ICD-10-CM | POA: Insufficient documentation

## 2015-07-19 DIAGNOSIS — F419 Anxiety disorder, unspecified: Secondary | ICD-10-CM | POA: Diagnosis not present

## 2015-07-19 DIAGNOSIS — F329 Major depressive disorder, single episode, unspecified: Secondary | ICD-10-CM | POA: Diagnosis not present

## 2015-07-19 DIAGNOSIS — R519 Headache, unspecified: Secondary | ICD-10-CM

## 2015-07-19 DIAGNOSIS — S0990XA Unspecified injury of head, initial encounter: Secondary | ICD-10-CM | POA: Diagnosis present

## 2015-07-19 DIAGNOSIS — Y998 Other external cause status: Secondary | ICD-10-CM | POA: Diagnosis not present

## 2015-07-19 DIAGNOSIS — Y9289 Other specified places as the place of occurrence of the external cause: Secondary | ICD-10-CM | POA: Diagnosis not present

## 2015-07-19 DIAGNOSIS — S0081XA Abrasion of other part of head, initial encounter: Secondary | ICD-10-CM | POA: Diagnosis not present

## 2015-07-19 DIAGNOSIS — R51 Headache: Secondary | ICD-10-CM

## 2015-07-19 HISTORY — DX: Reserved for concepts with insufficient information to code with codable children: IMO0002

## 2015-07-19 MED ORDER — METOCLOPRAMIDE HCL 5 MG/ML IJ SOLN
10.0000 mg | Freq: Once | INTRAMUSCULAR | Status: AC
  Administered 2015-07-19: 10 mg via INTRAMUSCULAR
  Filled 2015-07-19: qty 2

## 2015-07-19 MED ORDER — DIPHENHYDRAMINE HCL 50 MG/ML IJ SOLN
50.0000 mg | Freq: Once | INTRAMUSCULAR | Status: AC
  Administered 2015-07-19: 50 mg via INTRAMUSCULAR
  Filled 2015-07-19: qty 1

## 2015-07-19 MED ORDER — METOCLOPRAMIDE HCL 10 MG PO TABS: 10.0000 mg | ORAL_TABLET | Freq: Four times a day (QID) | ORAL | Status: AC | PRN

## 2015-07-19 MED ORDER — ACETAMINOPHEN 500 MG PO TABS
1000.0000 mg | ORAL_TABLET | Freq: Once | ORAL | Status: AC
  Administered 2015-07-19: 1000 mg via ORAL
  Filled 2015-07-19: qty 2

## 2015-07-19 NOTE — Discharge Instructions (Signed)
°Emergency Department Resource Guide °1) Find a Doctor and Pay Out of Pocket °Although you won't have to find out who is covered by your insurance plan, it is a good idea to ask around and get recommendations. You will then need to call the office and see if the doctor you have chosen will accept you as a new patient and what types of options they offer for patients who are self-pay. Some doctors offer discounts or will set up payment plans for their patients who do not have insurance, but you will need to ask so you aren't surprised when you get to your appointment. ° °2) Contact Your Local Health Department °Not all health departments have doctors that can see patients for sick visits, but many do, so it is worth a call to see if yours does. If you don't know where your local health department is, you can check in your phone book. The CDC also has a tool to help you locate your state's health department, and many state websites also have listings of all of their local health departments. ° °3) Find a Walk-in Clinic °If your illness is not likely to be very severe or complicated, you may want to try a walk in clinic. These are popping up all over the country in pharmacies, drugstores, and shopping centers. They're usually staffed by nurse practitioners or physician assistants that have been trained to treat common illnesses and complaints. They're usually fairly quick and inexpensive. However, if you have serious medical issues or chronic medical problems, these are probably not your best option. ° °No Primary Care Doctor: °- Call [facility] at  832-8000 - they can help you locate a primary care doctor that  accepts your insurance, provides certain services, etc. °- Physician Referral Service- 1-800-533-3463 ° °Chronic Pain Problems: °Organization         Address  Phone   Notes  °[facility]  (336) 297-2271 Patients need to be referred by their primary care doctor.  ° °Medication  Assistance: °Organization         Address  Phone   Notes  °[place] [facility] [address] °[place], [place] [address] (336) 641-8030 --Must be a resident of [place] °-- Must have NO insurance coverage whatsoever (no Medicaid/ Medicare, etc.) °-- The pt. MUST have a primary care doctor that directs their care regularly and follows them in the community °  °[facility]  (866) 331-1348   °[employer]  (888) 892-1162   ° °Agencies that provide inexpensive medical care: °Organization         Address  Phone   Notes  °[facility]  (336) 832-8035   °[facility]    (336) 832-7272   °[facility] [address] °[place], [place] [address] (336) 832-4777   °[facility] [address], °[place] (336) 271-4999   °[facility]    (336) 373-0678   °[facility]    (336) 272-1050   °[facility] ° [address], [place] Phone:  (336) 832-4444, Fax:  (336) 832-4440 Hours of Operation:  9 am - 6 pm, M-F.  Also accepts Medicaid/Medicare and self-pay.  °[facility] ° [address], [place] Phone: (336) 832-3150, Fax: (336) [phone]. Hours of Operation:  8:30 am - 5:30 pm, M-F.  Also accepts Medicaid and self-pay.  °[facility] [place] [address], [place] Phone: (336) 878-6027   °[facility] [address], [place], [place] (336)723-1848, Ext. 123 Mondays & Thursdays: 7-9 AM.  First 15 patients are seen on a first come, first serve basis. °  ° °Medicaid-accepting [place] Providers: ° °Organization         Address  Phone   Notes  °[facility] [address], [place] (336) 641-2100 Also accepts self-pay patients.  °[facility] [address], [place] ° (336) 856-9996   °[facility] [address], [place]  (336) 288-8857   °[facility] [address] [place] [address], [place] (336) 299-7000   °Veita Bland [address], [place]  ° (336) 373-1557 Only accepts [place] Access Medicaid patients after they have their name applied to their card.  ° °Self-Pay (no insurance) in [place]: ° °Organization         Address  Phone   Notes  °Sickle Cell Patients, [facility] [address], [place] (336) 832-1970   °[facility] [address], [place] (336) 832-4400   °[facility] ° [address], [place] (336) 992-4800   °[facility]/Dr. Osei-Bonsu ° [address] [place] [address], [place] or [address], [place] (336) [phone] Phone number for both [place] and [place] locations is the same.  °[facility] [address], [place] (336) 299-0000   °[facility] [address] [place] [address], [place] or [address] (336) 852-7530 °(336) 878-2260   °[facility] [address], [place] (336) 350-1642, phone; (336) 294-5005, fax Sees patients 1st and 3rd Saturday of every month.  Must not qualify for public or private insurance (i.e. Medicaid, Medicare, [place] Health Choice, Veterans' Benefits) • Household income should be no more than 200% of the poverty level •The clinic cannot treat you if you are pregnant or think you are pregnant • Sexually transmitted diseases are not treated at the clinic.  ° ° °Dental Care: °Organization         Address  Phone  Notes  °[place] [facility] [address], [place] (336) 641-6152 Accepts children up to age 21 who are enrolled in Medicaid or [place] Health Choice; pregnant women with a Medicaid card; and children who have applied for Medicaid or [place] Health Choice, but were declined, whose parents can pay a reduced fee at time of service.  °[place]  [facility] [place]  [address], [place] (336) 641-7733 Accepts children up to age 21 who are enrolled in Medicaid or [place] Health Choice; pregnant women with a Medicaid card; and children who have applied for Medicaid or [place] Health Choice, but were declined, whose parents can pay a reduced fee at time of service.  °[facility] ° [address], [place] (336) 641-4533 Patients are seen by appointment only. Walk-ins are not accepted. [facility] will see patients 18 years of age and older. °Monday - Tuesday (8am-5pm) °Most Wednesdays (8:30-5pm) °$30 per visit, cash only  °[facility] ° [address], [place] (336) 641-4533 Patients are seen by appointment only. Walk-ins are not accepted. [facility] will see patients 18 years of age and older. °One   Wednesday Evening (Monthly: Volunteer Based).  $30 per visit, cash only  °[facility]  (919) 537-3737 for adults; Children under age 4, call Graduate Pediatric Dentistry at ([phone]) [phone]. Children aged 4-14, please call (919) [phone] to request a pediatric application. ° Dental services are provided in all areas of dental care including fillings, crowns and bridges, complete and partial dentures, implants, gum treatment, root canals, and extractions. Preventive care is also provided. Treatment is provided to both adults and children. °Patients are selected via a lottery and there is often a waiting list. °  °[facility] [address], °[place] ° (336) 763-8833 [email or web address] °  °[facility] [address], [place], [place] (336)723-1848, Ext. 123 Second and Fourth Thursday of each month, opens at 6:30 AM; Clinic ends at 9 AM.  Patients are seen on a first-come first-served basis, and a limited number are seen during each clinic.  ° °[facility] ° [address], [place], [place] (336) [phone]    Eligibility Requirements °You must have lived in [place], or [place] counties for at least the last three months. °  You cannot be eligible for state or federal sponsored healthcare insurance, including Veterans Administration, Medicaid, or Medicare. °  You generally cannot be eligible for healthcare insurance through your employer.  °  How to apply: °Eligibility screenings are held every Tuesday and Wednesday afternoon from 1:00 pm until 4:00 pm. You do not need an appointment for the interview!  °[facility] [address], [place], [place] 336-631-2330   °[facility]  336-342-8273   °[facility]  336-703-3100   °[facility]  336-570-6415   ° °Behavioral Health Resources in the Community: °Intensive Outpatient Programs °Organization         Address  Phone  Notes  °[facility] [address], [place], [place] 336-878-6098   °[facility] [address], [place], [place] 336-832-9800   °[facility] [address], [place], [place] ° 336-882-2125   °[facility] [address],  °[place], [place] 1-800-853-5163 or 336-641-4981   °Substance Abuse Resources °Organization         Address  Phone  Notes  °[facility]  336-882-2125   °[facility]  336-784-9470   °[facility]  336-285-9073   °[facility]  336-845-3988   °Residential & Outpatient Substance Abuse Program  1-800-659-3381   °Psychological Services °Organization         Address  Phone  Notes  °[facility]  336- 832-9600   °[facility]  336- 378-7881   °[facility] [address], [place] 1-800-853-5163 or 336-641-4981   ° °Mobile Crisis Teams °Organization         Address  Phone  Notes  °[facility], Mobile Crisis Care Unit  1-877-626-1772   °Assertive °[facility] ° [address]  [place], [place] 336-834-9664   °Sharon DeEsch [address] °[place] 336-554-5454   ° °Self-Help/Support Groups °Organization         Address  Phone             Notes  °[facility]  - variety of support groups  336- 373-1402 Call for more information  °Narcotics Anonymous (NA), [facility] [address], °[place]  2 meetings at this location  ° °  Residential Treatment Programs Organization         Address  Phone  Notes  [facility] [address],    [place]  1-610-960-4540   [facility]  [address] [place] [address], [place], [place] 478-295-6213   [facility] [address] [place], [place] 086-578-4696 Admissions: 8am-3pm M-F  [facility] [address],    [place], [place] 295-284-1324   [facility] [address] [place], [place], [place] 401-027-2536   [facility] [address]  [place], [place] 644-034-7425   [facility] Intensive Outpatient [address], [place], [place] 956-387-5643   [facility] ([facility] [address]  [place], [place] 3-295-188-4166 or (445) 292-1662   [facility] [address], [place], [place] 323-557-3220 Accepts Medicaid  [facility] [address]  [place] 2-542-706-2376 Substance Abuse/Addiction Treatment   [facility]         Address  Phone  Notes  [facility]  941-107-0450   Angie Fava, PhD [address] [place], [place]   856-401-2320 or 608-501-1958   [facility]   [address] [place], [place] 628-397-7323   [facility] [address] [place] [address], [place], [place] 843-583-1913 Insurance/Medicaid/sponsorship through [facility] Families [address]                                    [place], [place] [phone] Therapy/tele-psych/case    [facility] [address], [place] 937-122-9074    Dr. Lolly Mustache  838-212-6251   [facility]  [employer] [facility] Dept. 1) [address] [place] 2) [address] [place] 3)  [address], [place] (336)[phone] (830)[phone]  564-228-1595   [facility] Child Abuse Hotline 509-301-0496 or 409-677-4197 (After Hours)      Take over the counter tylenol and ibuprofen (OR excedrin) and benadryl, as directed on packaging, with the prescription given to you today, as needed for headache.  Keep a headache diary, as discussed.  Call your regular medical doctor tomorrow to schedule a follow up appointment within the next 2 days.  Return to the Emergency Department immediately sooner if worsening.

## 2015-07-19 NOTE — ED Notes (Signed)
Patient reports headache since Monday that has worsened. Now reports sensitivity to light and sound. Also reports he fell and hit head on Tuesday, abrasion noted over right eyebrow. Patient states he does not remember hitting his head, states "Only thing I remember is sitting up and I was on the floor, so I may have lost consciousness." Patient saw PCP in office today and received IM shot of Toradol for pain.

## 2015-07-19 NOTE — ED Provider Notes (Signed)
CSN: [id number]     Arrival date & time 07/19/15  1922 History   First MD Initiated Contact with Patient 07/19/15 2014     Chief Complaint  Patient presents with  . Headache      HPI Pt was seen at 2030. Per pt, c/o gradual onset and persistence of constant "headache" for the past 2 days. States he fell at hit his head yesterday which has "made the headache worse." Denies headache was sudden or maximal in onset or at any time.    Pt took OTC excedrin this morning with partial relief. Pt was evaluated by his PMD this afternoon, received IM toradol with "some" relief. Pt's mother states pt was told "if the headache didn't go away we needed to come here and get a CT scan." Denies visual changes, no focal motor weakness, no tingling/numbness in extremities, no fevers, no neck pain, no rash.     Past Medical History  Diagnosis Date  . Osgood-Schlatter's disease   . Depression   . Anxiety   . Patellar tendonitis   . Chronic knee pain   . Complex regional pain syndrome    Past Surgical History  Procedure Laterality Date  . Wisdom tooth extraction     Family History  Problem Relation Age of Onset  . Diabetes Father   . ADD / ADHD Father   . Bipolar disorder Father   . Anxiety disorder Father   . Alcohol abuse Father   . Alcohol abuse Maternal Grandfather   . Anxiety disorder Maternal Grandfather   . Depression Maternal Grandfather   . Dementia Neg Hx   . Drug abuse Neg Hx   . OCD Neg Hx   . Paranoid behavior Neg Hx   . Schizophrenia Neg Hx   . Seizures Neg Hx   . Sexual abuse Neg Hx   . Physical abuse Neg Hx    Social History  Substance Use Topics  . Smoking status: Current Some Day Smoker    Types: Cigarettes  . Smokeless tobacco: Never Used  . Alcohol Use: No    Review of Systems ROS: Statement: All systems negative except as marked or noted in the HPI; Constitutional: Negative for fever and chills. ; ; Eyes: Negative for eye pain, redness and discharge. ; ; ENMT:  Negative for ear pain, hoarseness, nasal congestion, sinus pressure and sore throat. ; ; Cardiovascular: Negative for chest pain, palpitations, diaphoresis, dyspnea and peripheral edema. ; ; Respiratory: Negative for cough, wheezing and stridor. ; ; Gastrointestinal: Negative for nausea, vomiting, diarrhea, abdominal pain, blood in stool, hematemesis, jaundice and rectal bleeding. . ; ; Genitourinary: Negative for dysuria, flank pain and hematuria. ; ; Musculoskeletal: Negative for back pain and neck pain. Negative for swelling and trauma.; ; Skin: Negative for pruritus, rash, abrasions, blisters, bruising and skin lesion.; ; Neuro: +headache. Negative for lightheadedness and neck stiffness. Negative for weakness, altered level of consciousness , altered mental status, extremity weakness, paresthesias, involuntary movement, seizure.     Allergies  Risperdal and Indocin  Home Medications   Prior to Admission medications   Medication Sig Start Date End Date Taking? Authorizing Provider  aspirin-acetaminophen-caffeine (EXCEDRIN MIGRAINE) [phone] MG per tablet Take 2 tablets by mouth every 6 (six) hours as needed for headache.   Yes Historical Provider, MD  busPIRone (BUSPAR) 15 MG tablet Take 15 mg by mouth 3 (three) times daily.   Yes Historical Provider, MD  diazepam (VALIUM) 5 MG tablet Take 5 mg by mouth 3 (  three) times daily.   Yes Historical Provider, MD  DULoxetine (CYMBALTA) 30 MG capsule Take 30 mg by mouth daily. Takes with 60 mg capsule to =90 mg   Yes Historical Provider, MD  DULoxetine (CYMBALTA) 60 MG capsule Take 60 mg by mouth daily. Takes with 30 mg capsule to =90 mg   Yes Historical Provider, MD  hydrocortisone cream 1 % Apply 1 application topically as needed for itching (only when having Physical Therapy).   Yes Historical Provider, MD  Suvorexant (BELSOMRA) 15 MG TABS Take 15 mg by mouth at bedtime.   Yes Historical Provider, MD  tapentadol (NUCYNTA) 50 MG TABS tablet Take 50 mg  by mouth 3 (three) times daily.   Yes Historical Provider, MD  cephALEXin (KEFLEX) 500 MG capsule Take 1 capsule (500 mg total) by mouth 4 (four) times daily. Patient not taking: Reported on 07/19/2015 06/20/15   Teressa Lower, NP  ibuprofen (ADVIL,MOTRIN) 200 MG tablet Take 800 mg by mouth every 6 (six) hours as needed for moderate pain.    Historical Provider, MD  Melatonin 5 MG TABS Take 1 tablet by mouth at bedtime as needed (sleep).    Historical Provider, MD  oxycodone-acetaminophen (PERCOCET) 2.5-325 MG per tablet Take 1 tablet by mouth every 4 (four) hours as needed for pain. Patient not taking: Reported on 06/19/2015 06/14/15   Burgess Amor, PA-C  oxymetazoline (AFRIN) 0.05 % nasal spray Place 1 spray into both nostrils daily as needed for congestion.    Historical Provider, MD   BP 131/67 mmHg  Pulse 82  Temp(Src) 98.6 F (37 C) (Oral)  Resp 24  Ht 6\' 1"  (1.854 m)  Wt 150 lb (68.04 kg)  BMI 19.79 kg/m2  SpO2 100% Physical Exam  2035: Physical examination:  Nursing notes reviewed; Vital signs and O2 SAT reviewed;  Constitutional: Well developed, Well nourished, Well hydrated, In no acute distress; Head:  Normocephalic, +small superficial abrasion right forehead. No hematoma, no ecchymosis.; Eyes: EOMI, PERRL, No scleral icterus; ENMT: TM's clear bilat.  Mouth and pharynx normal, Mucous membranes moist; Neck: Supple, Full range of motion, No lymphadenopathy; Cardiovascular: Regular rate and rhythm, No murmur, rub, or gallop; Respiratory: Breath sounds clear & equal bilaterally, No rales, rhonchi, wheezes.  Speaking full sentences with ease, Normal respiratory effort/excursion; Chest: Nontender, Movement normal; Abdomen: Soft, Nontender, Nondistended, Normal bowel sounds; Genitourinary: No CVA tenderness; Extremities: Pulses normal, No tenderness, No edema, No calf edema or asymmetry.; Neuro: AA&Ox3, Major CN grossly intact. No facial droop. Speech clear. No gross focal motor or sensory deficits  in extremities.; Skin: Color normal, Warm, Dry.   ED Course  Procedures (including critical care time) Labs Review   Imaging Review  I have personally reviewed and evaluated these images and lab results as part of my medical decision-making.   EKG Interpretation None      MDM  MDM Reviewed: previous chart, nursing note and vitals Interpretation: CT scan     Ct Head Wo Contrast 07/19/2015   CLINICAL DATA:  19 year old male with with right frontal headache and nausea.  EXAM: CT HEAD WITHOUT CONTRAST  TECHNIQUE: Contiguous axial images were obtained from the base of the skull through the vertex without intravenous contrast.  COMPARISON:  CT dated 07/23/2011  FINDINGS: The ventricles and the sulci are appropriate in size for the patient's age. There is no intracranial hemorrhage. No midline shift or mass effect identified. The gray-white matter differentiation is preserved.  The visualized paranasal sinuses and mastoid air cells are  well aerated. The calvarium is intact.  IMPRESSION: No acute intracranial pathology.   Electronically Signed   By: Elgie Collard M.D.   On: 07/19/2015 20:41    2130:  Pt and mother reassured. Tx symptomatically, f/u PMD. Dx and testing d/w pt and family.  Questions answered.  Verb understanding, agreeable to d/c home with outpt f/u.   Samuel Jester, DO 07/21/15 2148

## 2015-08-09 ENCOUNTER — Emergency Department (HOSPITAL_COMMUNITY): Payer: BLUE CROSS/BLUE SHIELD

## 2015-08-09 ENCOUNTER — Encounter (HOSPITAL_COMMUNITY): Payer: Self-pay

## 2015-08-09 ENCOUNTER — Emergency Department (HOSPITAL_COMMUNITY)
Admission: EM | Admit: 2015-08-09 | Discharge: 2015-08-09 | Disposition: A | Discharge status: Home / Self Care | Payer: BLUE CROSS/BLUE SHIELD | Attending: Emergency Medicine | Admitting: Emergency Medicine

## 2015-08-09 DIAGNOSIS — F329 Major depressive disorder, single episode, unspecified: Secondary | ICD-10-CM | POA: Diagnosis not present

## 2015-08-09 DIAGNOSIS — M25561 Pain in right knee: Secondary | ICD-10-CM

## 2015-08-09 DIAGNOSIS — Z720 Tobacco use: Secondary | ICD-10-CM | POA: Diagnosis not present

## 2015-08-09 DIAGNOSIS — Z8739 Personal history of other diseases of the musculoskeletal system and connective tissue: Secondary | ICD-10-CM | POA: Insufficient documentation

## 2015-08-09 DIAGNOSIS — S8991XA Unspecified injury of right lower leg, initial encounter: Secondary | ICD-10-CM | POA: Diagnosis present

## 2015-08-09 DIAGNOSIS — X58XXXA Exposure to other specified factors, initial encounter: Secondary | ICD-10-CM | POA: Diagnosis not present

## 2015-08-09 DIAGNOSIS — G8929 Other chronic pain: Secondary | ICD-10-CM | POA: Insufficient documentation

## 2015-08-09 DIAGNOSIS — F419 Anxiety disorder, unspecified: Secondary | ICD-10-CM | POA: Insufficient documentation

## 2015-08-09 DIAGNOSIS — Y9289 Other specified places as the place of occurrence of the external cause: Secondary | ICD-10-CM | POA: Diagnosis not present

## 2015-08-09 DIAGNOSIS — Y9301 Activity, walking, marching and hiking: Secondary | ICD-10-CM | POA: Insufficient documentation

## 2015-08-09 DIAGNOSIS — Y998 Other external cause status: Secondary | ICD-10-CM | POA: Diagnosis not present

## 2015-08-09 DIAGNOSIS — Z79899 Other long term (current) drug therapy: Secondary | ICD-10-CM | POA: Insufficient documentation

## 2015-08-09 MED ORDER — KETOROLAC TROMETHAMINE 30 MG/ML IJ SOLN
30.0000 mg | Freq: Once | INTRAMUSCULAR | Status: AC
  Administered 2015-08-09: 30 mg via INTRAMUSCULAR
  Filled 2015-08-09: qty 1

## 2015-08-09 MED ORDER — OXYCODONE-ACETAMINOPHEN 2.5-325 MG PO TABS: 1.0000 | ORAL_TABLET | Freq: Four times a day (QID) | ORAL | Status: DC | PRN

## 2015-08-09 MED ORDER — OXYCODONE-ACETAMINOPHEN 5-325 MG PO TABS: 1.0000 | ORAL_TABLET | Freq: Four times a day (QID) | ORAL | Status: DC | PRN

## 2015-08-09 MED ORDER — OXYCODONE-ACETAMINOPHEN 5-325 MG PO TABS: 0.5000 | ORAL_TABLET | Freq: Four times a day (QID) | ORAL | Status: DC | PRN

## 2015-08-09 NOTE — ED Provider Notes (Signed)
CSN: [id number]     Arrival date & time 08/09/15  1249 History   First MD Initiated Contact with Patient 08/09/15 1358     Chief Complaint  Patient presents with  . Knee Pain     (Consider location/radiation/quality/duration/timing/severity/associated sxs/prior Treatment) HPI Comments: 19 y.o. Male with history of chronic right knee pain, depression, anxiety presents with his mother for right knee pain.  The patient reports that his chronic knee pain has been worse than usual for the last week or so.  He says that today while he was walking he accidentally hyperextended the knee and has had severe pain ever since.  He has been using crutches because of the knee pain.  He denies the knee looking like it was out of place or dislocation.  Reports normal strength and sensation.  He reports a scheduled appointment with a pain specialist in 2 weeks regarding his knee pain and follows with PT for it as well.  His orthopedic specialist is at [facility] in [place].     Past Medical History  Diagnosis Date  . Osgood-Schlatter's disease   . Depression   . Anxiety   . Patellar tendonitis   . Chronic knee pain   . Complex regional pain syndrome    Past Surgical History  Procedure Laterality Date  . Wisdom tooth extraction     Family History  Problem Relation Age of Onset  . Diabetes Father   . ADD / ADHD Father   . Bipolar disorder Father   . Anxiety disorder Father   . Alcohol abuse Father   . Alcohol abuse Maternal Grandfather   . Anxiety disorder Maternal Grandfather   . Depression Maternal Grandfather   . Dementia Neg Hx   . Drug abuse Neg Hx   . OCD Neg Hx   . Paranoid behavior Neg Hx   . Schizophrenia Neg Hx   . Seizures Neg Hx   . Sexual abuse Neg Hx   . Physical abuse Neg Hx    Social History  Substance Use Topics  . Smoking status: Current Some Day Smoker    Types: Cigarettes  . Smokeless tobacco: Never Used  . Alcohol Use: No    Review of Systems  Constitutional: Negative  for fever and chills.  HENT: Negative for congestion.   Eyes: Negative for pain.  Respiratory: Negative for shortness of breath.   Cardiovascular: Negative for chest pain and leg swelling.  Gastrointestinal: Negative for abdominal pain.  Genitourinary: Negative for flank pain.  Musculoskeletal: Positive for arthralgias (right knee pain). Negative for back pain and neck pain.  Neurological: Negative for weakness and numbness.  Hematological: Does not bruise/bleed easily.      Allergies  Risperdal and Indocin  Home Medications   Prior to Admission medications   Medication Sig Start Date End Date Taking? Authorizing Provider  busPIRone (BUSPAR) 15 MG tablet Take 15 mg by mouth 3 (three) times daily.   Yes Historical Provider, MD  diazepam (VALIUM) 5 MG tablet Take 5 mg by mouth 3 (three) times daily.   Yes Historical Provider, MD  DULoxetine (CYMBALTA) 30 MG capsule Take 30 mg by mouth daily. Takes with 60 mg capsule to =90 mg   Yes Historical Provider, MD  DULoxetine (CYMBALTA) 60 MG capsule Take 60 mg by mouth daily. Takes with 30 mg capsule to =90 mg   Yes Historical Provider, MD  Melatonin 5 MG TABS Take 1 tablet by mouth at bedtime as needed (sleep).   Yes Historical  Provider, MD  OLANZapine (ZYPREXA) 10 MG tablet Take 10 mg by mouth at bedtime.   Yes Historical Provider, MD  tapentadol (NUCYNTA) 50 MG TABS tablet Take 50 mg by mouth 3 (three) times daily.   Yes Historical Provider, MD  aspirin-acetaminophen-caffeine (EXCEDRIN MIGRAINE) [phone] MG per tablet Take 2 tablets by mouth every 6 (six) hours as needed for headache.    Historical Provider, MD  cephALEXin (KEFLEX) 500 MG capsule Take 1 capsule (500 mg total) by mouth 4 (four) times daily. Patient not taking: Reported on 07/19/2015 06/20/15   Teressa Lower, NP  hydrocortisone cream 1 % Apply 1 application topically as needed for itching (only when having Physical Therapy).    Historical Provider, MD  ibuprofen  (ADVIL,MOTRIN) 200 MG tablet Take 800 mg by mouth every 6 (six) hours as needed for moderate pain.    Historical Provider, MD  metoCLOPramide (REGLAN) 10 MG tablet Take 1 tablet (10 mg total) by mouth every 6 (six) hours as needed for nausea (or headache). 07/19/15   Samuel Jester, DO  oxycodone-acetaminophen (PERCOCET) 2.5-325 MG per tablet Take 1 tablet by mouth every 4 (four) hours as needed for pain. Patient not taking: Reported on 06/19/2015 06/14/15   Burgess Amor, PA-C  oxymetazoline (AFRIN) 0.05 % nasal spray Place 1 spray into both nostrils daily as needed for congestion.    Historical Provider, MD   BP 142/79 mmHg  Pulse 123  Temp(Src) 98.3 F (36.8 C) (Oral)  Resp 18  Ht 6' (1.829 m)  Wt 160 lb (72.576 kg)  BMI 21.70 kg/m2  SpO2 97% Physical Exam  Constitutional: He is oriented to person, place, and time. He appears well-developed and well-nourished. No distress.  HENT:  Head: Normocephalic and atraumatic.  Right Ear: External ear normal.  Left Ear: External ear normal.  Mouth/Throat: Oropharynx is clear and moist. No oropharyngeal exudate.  Eyes: EOM are normal. Pupils are equal, round, and reactive to light.  Neck: Normal range of motion. Neck supple.  Cardiovascular: Normal rate, regular rhythm, normal heart sounds and intact distal pulses.   No murmur heard. Pulmonary/Chest: Effort normal. No respiratory distress. He has no wheezes. He has no rales.  Abdominal: Soft. He exhibits no distension. There is no tenderness.  Musculoskeletal:       Right hip: Normal.       Left hip: Normal.       Right knee: He exhibits normal range of motion, no swelling, no effusion, no ecchymosis, no deformity, no erythema and normal patellar mobility. Tenderness found. Medial joint line and lateral joint line tenderness noted.       Left knee: Normal.       Right ankle: Normal.       Left ankle: Normal.  Neurological: He is alert and oriented to person, place, and time. He has normal  strength. No sensory deficit.  Skin: Skin is warm and dry. No rash noted. He is not diaphoretic.  Vitals reviewed.   ED Course  Procedures (including critical care time) Labs Review Labs Reviewed - No data to display  Imaging Review Dg Knee Complete 4 Views Right  08/09/2015   CLINICAL DATA:  Chronic right knee pain. Hyperextension of the right knee today. Pain and swelling. Subsequent encounter.  EXAM: RIGHT KNEE - COMPLETE 4+ VIEW  COMPARISON:  Plain films right knee 06/19/2015 and 06/02/2015. MRI right knee 05/16/2015.  FINDINGS: There is no evidence of fracture, dislocation, or joint effusion. There is no evidence of arthropathy or other  focal bone abnormality. Soft tissues are unremarkable.  IMPRESSION: Normal examination.   Electronically Signed   By: Drusilla Kanner M.D.   On: 08/09/2015 15:02   I have personally reviewed and evaluated these images and lab results as part of my medical decision-making.   EKG Interpretation None      MDM  Patient seen and evaluated in stable condition.  Neurovascularly intact.  Xray negative for acute process.  Patient given shot of toradol.  Given short prescription for his previously prescribed percocet.  Instructed to keep follow up appointment with the pain clinic.  Discharged in stable condition. Final diagnoses:  None    1. Right knee pain, acute on chronic    Leta Baptist, MD 08/11/15 [phone]

## 2015-08-09 NOTE — Discharge Instructions (Signed)

## 2015-08-09 NOTE — ED Notes (Signed)
Pt has chronic problems with r knee.  Reports today was walking and accidentally hyperextended r knee.  Reports pain and swelling since then.

## 2015-08-14 ENCOUNTER — Encounter (HOSPITAL_COMMUNITY): Payer: Self-pay | Admitting: Emergency Medicine

## 2015-08-14 ENCOUNTER — Emergency Department (HOSPITAL_COMMUNITY)
Admission: EM | Admit: 2015-08-14 | Discharge: 2015-08-14 | Disposition: A | Discharge status: Home / Self Care | Payer: BLUE CROSS/BLUE SHIELD | Attending: Emergency Medicine | Admitting: Emergency Medicine

## 2015-08-14 ENCOUNTER — Emergency Department (HOSPITAL_COMMUNITY): Payer: BLUE CROSS/BLUE SHIELD

## 2015-08-14 DIAGNOSIS — S8001XA Contusion of right knee, initial encounter: Secondary | ICD-10-CM | POA: Insufficient documentation

## 2015-08-14 DIAGNOSIS — Z720 Tobacco use: Secondary | ICD-10-CM | POA: Insufficient documentation

## 2015-08-14 DIAGNOSIS — Z79899 Other long term (current) drug therapy: Secondary | ICD-10-CM | POA: Insufficient documentation

## 2015-08-14 DIAGNOSIS — W1839XA Other fall on same level, initial encounter: Secondary | ICD-10-CM | POA: Diagnosis not present

## 2015-08-14 DIAGNOSIS — Y9289 Other specified places as the place of occurrence of the external cause: Secondary | ICD-10-CM | POA: Diagnosis not present

## 2015-08-14 DIAGNOSIS — Z8739 Personal history of other diseases of the musculoskeletal system and connective tissue: Secondary | ICD-10-CM | POA: Insufficient documentation

## 2015-08-14 DIAGNOSIS — Y9301 Activity, walking, marching and hiking: Secondary | ICD-10-CM | POA: Insufficient documentation

## 2015-08-14 DIAGNOSIS — Y998 Other external cause status: Secondary | ICD-10-CM | POA: Insufficient documentation

## 2015-08-14 DIAGNOSIS — G8929 Other chronic pain: Secondary | ICD-10-CM | POA: Insufficient documentation

## 2015-08-14 DIAGNOSIS — F329 Major depressive disorder, single episode, unspecified: Secondary | ICD-10-CM | POA: Diagnosis not present

## 2015-08-14 DIAGNOSIS — F419 Anxiety disorder, unspecified: Secondary | ICD-10-CM | POA: Diagnosis not present

## 2015-08-14 DIAGNOSIS — S8991XA Unspecified injury of right lower leg, initial encounter: Secondary | ICD-10-CM | POA: Diagnosis present

## 2015-08-14 MED ORDER — KETOROLAC TROMETHAMINE 60 MG/2ML IM SOLN
60.0000 mg | Freq: Once | INTRAMUSCULAR | Status: AC
  Administered 2015-08-14: 60 mg via INTRAMUSCULAR
  Filled 2015-08-14: qty 2

## 2015-08-14 MED ORDER — HYDROCODONE-ACETAMINOPHEN 5-325 MG PO TABS: 1.0000 | ORAL_TABLET | ORAL | Status: AC | PRN

## 2015-08-14 MED ORDER — HYDROCODONE-ACETAMINOPHEN 5-325 MG PO TABS
2.0000 | ORAL_TABLET | Freq: Once | ORAL | Status: AC
  Administered 2015-08-14: 2 via ORAL
  Filled 2015-08-14: qty 2

## 2015-08-14 NOTE — ED Notes (Addendum)
Patient states he fell on his right knee yesterday and is complaining of pain to right knee. Patient ambulatory with crutches at triage. Patient has history of same.

## 2015-08-14 NOTE — ED Provider Notes (Signed)
CSN: [id number]     Arrival date & time 08/14/15  1326 History  By signing my name below, I, Johnathan Lane, attest that this documentation has been prepared under the direction and in the presence of Ivery Quale, PA-C.  Electronically Signed: Gwenyth Lane, ED Scribe. 08/14/2015. 3:16 PM.   Chief Complaint  Patient presents with  . Knee Pain   Patient is a 19 y.o. male presenting with knee pain. The history is provided by the patient and a parent. No language interpreter was used.  Knee Pain Location:  Knee Time since incident:  1 day Injury: yes   Mechanism of injury: fall   Fall:    Fall occurred:  Walking   Entrapped after fall: no   Knee location:  R knee Pain details:    Quality:  Aching   Severity:  Moderate   Onset quality:  Gradual Chronicity:  Chronic Prior injury to area:  Yes Relieved by:  Nothing Worsened by:  Extension Ineffective treatments: Nucynta. Associated symptoms: no numbness     HPI Comments: Johnathan Lane is a 19 y.o. male with a history of chronic right knee pain, Osgood-Schlatter disease and patellar tendonitis who presents to the Emergency Department complaining of an acute exacerbation of chronic right knee pain that started 4 years ago and became worse yesterday after he fell while walking. He states swelling of his knee as an associated symptom. His pain becomes worse with extension, but improves with mild flexion and sitting. Pt is currently being treated with Nucynta, prescribed by a pain clinic, with no relief. His PCP referred him here for treatment until his follow-up appointment with the pain clinic in 4 days. Pt is currently followed by an orthopedist and is in physical therapy for his knee. He denies numbness.  Past Medical History  Diagnosis Date  . Osgood-Schlatter's disease   . Depression   . Anxiety   . Patellar tendonitis   . Chronic knee pain   . Complex regional pain syndrome    Past Surgical History  Procedure  Laterality Date  . Wisdom tooth extraction     Family History  Problem Relation Age of Onset  . Diabetes Father   . ADD / ADHD Father   . Bipolar disorder Father   . Anxiety disorder Father   . Alcohol abuse Father   . Alcohol abuse Maternal Grandfather   . Anxiety disorder Maternal Grandfather   . Depression Maternal Grandfather   . Dementia Neg Hx   . Drug abuse Neg Hx   . OCD Neg Hx   . Paranoid behavior Neg Hx   . Schizophrenia Neg Hx   . Seizures Neg Hx   . Sexual abuse Neg Hx   . Physical abuse Neg Hx    Social History  Substance Use Topics  . Smoking status: Current Some Day Smoker    Types: Cigarettes  . Smokeless tobacco: Never Used  . Alcohol Use: No    Review of Systems  Musculoskeletal: Positive for joint swelling and arthralgias.  Skin: Negative for wound.  Neurological: Negative for numbness.  All other systems reviewed and are negative.  Allergies  Risperdal and Indocin  Home Medications   Prior to Admission medications   Medication Sig Start Date End Date Taking? Authorizing Provider  aspirin-acetaminophen-caffeine (EXCEDRIN MIGRAINE) [phone] MG per tablet Take 2 tablets by mouth every 6 (six) hours as needed for headache.    Historical Provider, MD  busPIRone (BUSPAR) 15 MG tablet Take 15 mg  by mouth 3 (three) times daily.    Historical Provider, MD  cephALEXin (KEFLEX) 500 MG capsule Take 1 capsule (500 mg total) by mouth 4 (four) times daily. Patient not taking: Reported on 07/19/2015 06/20/15   Teressa Lower, NP  diazepam (VALIUM) 5 MG tablet Take 5 mg by mouth 3 (three) times daily.    Historical Provider, MD  DULoxetine (CYMBALTA) 30 MG capsule Take 30 mg by mouth daily. Takes with 60 mg capsule to =90 mg    Historical Provider, MD  DULoxetine (CYMBALTA) 60 MG capsule Take 60 mg by mouth daily. Takes with 30 mg capsule to =90 mg    Historical Provider, MD  hydrocortisone cream 1 % Apply 1 application topically as needed for itching (only when  having Physical Therapy).    Historical Provider, MD  ibuprofen (ADVIL,MOTRIN) 200 MG tablet Take 800 mg by mouth every 6 (six) hours as needed for moderate pain.    Historical Provider, MD  Melatonin 5 MG TABS Take 1 tablet by mouth at bedtime as needed (sleep).    Historical Provider, MD  metoCLOPramide (REGLAN) 10 MG tablet Take 1 tablet (10 mg total) by mouth every 6 (six) hours as needed for nausea (or headache). 07/19/15   Samuel Jester, DO  OLANZapine (ZYPREXA) 10 MG tablet Take 10 mg by mouth at bedtime.    Historical Provider, MD  oxyCODONE-acetaminophen (ROXICET) 5-325 MG tablet Take 0.5 tablets by mouth every 6 (six) hours as needed for severe pain. 08/09/15   Leta Baptist, MD  oxymetazoline (AFRIN) 0.05 % nasal spray Place 1 spray into both nostrils daily as needed for congestion.    Historical Provider, MD  tapentadol (NUCYNTA) 50 MG TABS tablet Take 50 mg by mouth 3 (three) times daily.    Historical Provider, MD   BP 119/86 mmHg  Pulse 104  Temp(Src) 97.7 F (36.5 C) (Oral)  Resp 14  Ht 6' (1.829 m)  Wt 160 lb (72.576 kg)  BMI 21.70 kg/m2  SpO2 99% Physical Exam  Constitutional: He appears well-developed and well-nourished. No distress.  HENT:  Head: Normocephalic and atraumatic.  Eyes: Conjunctivae and EOM are normal.  Neck: Neck supple. No tracheal deviation present.  Cardiovascular: Normal rate, regular rhythm and normal heart sounds.   Pulmonary/Chest: Effort normal and breath sounds normal. No respiratory distress. He has no wheezes. He has no rales.  Musculoskeletal:  Right leg: Achilles Tendon intact Posterior tibialis 2+ No temperature changes of the tibial area Excellent hair growth of the lower leg Mild swelling of the anterior tibial tuberosity extending into the patellar tendon No deformity of the quadriceps area No post mass Patellar midline on flexion No effusion present  Skin: Skin is warm and dry.  Psychiatric: He has a normal mood and affect.  His behavior is normal.  Nursing note and vitals reviewed.   ED Course  Procedures   DIAGNOSTIC STUDIES: Oxygen Saturation is 99% on RA, normal by my interpretation.    COORDINATION OF CARE: 3:13 PM Discussed treatment plan with pt and his mother at bedside which includes pain management. They agreed to plan.  Imaging Review No results found. I have personally reviewed and evaluated these images as part of my medical decision-making.  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 Patient is scheduled to see a pain management specialist on Friday. She attempted to see the pain specialist earlier  but they could not see him. They also attempted to see the family physician for management until seen by the pain specialist, but were told that because he is referred to a pain specialist and is being treated by a pain specialist that they could no longer do any pain management, but it would be okay to go to the emergency department.  No evidence of acute fracture or dislocation related to the most recent fall. The patient is asked to use his knee immobilizer and crutches when up and about. A prescription for 12 Norco tablets given to the patient, with instructions that pain management will need to be completed by the pain management specialist.    Final diagnoses:  None    *I have reviewed nursing notes, vital signs, and all appropriate lab and imaging results for this patient.**  **I personally performed the services described in this documentation, which was scribed in my presence. The recorded information has been reviewed and is accurate.Ivery Quale, PA-C 08/14/15 1530  Mancel Bale, MD 08/14/15 [phone]

## 2015-08-14 NOTE — Discharge Instructions (Signed)
Please use your crutches is much as possible. Please continue to use sure knee immobilizer for additional stability. Please see your pain management specialist as scheduled. May add Norco one or 2 tablets every 4 hours to your current medications. This may cause drowsiness, please use with caution.

## 2015-11-30 ENCOUNTER — Encounter (HOSPITAL_COMMUNITY): Payer: Self-pay | Admitting: Emergency Medicine

## 2015-11-30 ENCOUNTER — Emergency Department (HOSPITAL_COMMUNITY)
Admission: EM | Admit: 2015-11-30 | Discharge: 2015-11-30 | Disposition: A | Discharge status: Home / Self Care | Payer: BLUE CROSS/BLUE SHIELD | Attending: Emergency Medicine | Admitting: Emergency Medicine

## 2015-11-30 DIAGNOSIS — G8929 Other chronic pain: Secondary | ICD-10-CM | POA: Diagnosis not present

## 2015-11-30 DIAGNOSIS — F329 Major depressive disorder, single episode, unspecified: Secondary | ICD-10-CM | POA: Diagnosis not present

## 2015-11-30 DIAGNOSIS — M25561 Pain in right knee: Secondary | ICD-10-CM | POA: Insufficient documentation

## 2015-11-30 DIAGNOSIS — F17210 Nicotine dependence, cigarettes, uncomplicated: Secondary | ICD-10-CM | POA: Diagnosis not present

## 2015-11-30 DIAGNOSIS — F419 Anxiety disorder, unspecified: Secondary | ICD-10-CM | POA: Diagnosis not present

## 2015-11-30 DIAGNOSIS — Z79899 Other long term (current) drug therapy: Secondary | ICD-10-CM | POA: Diagnosis not present

## 2015-11-30 MED ORDER — KETOROLAC TROMETHAMINE 60 MG/2ML IM SOLN
60.0000 mg | Freq: Once | INTRAMUSCULAR | Status: AC
  Administered 2015-11-30: 60 mg via INTRAMUSCULAR
  Filled 2015-11-30: qty 2

## 2015-11-30 MED ORDER — OXYCODONE-ACETAMINOPHEN 5-325 MG PO TABS: 1.0000 | ORAL_TABLET | ORAL | Status: DC | PRN

## 2015-11-30 NOTE — Discharge Instructions (Signed)
Knee Pain  Knee pain is a common problem. It can have many causes. The pain often goes away by following your doctor's home care instructions. Treatment for ongoing pain will depend on the cause of your pain. If your knee pain continues, more tests may be needed to diagnose your condition. Tests may include X-rays or other imaging studies of your knee.  HOME CARE   Take medicines only as told by your doctor.   Rest your knee and keep it raised (elevated) while you are resting.   Do not do things that cause pain or make your pain worse.   Avoid activities where both feet leave the ground at the same time, such as running, jumping rope, or doing jumping jacks.   Apply ice to the knee area:    Put ice in a plastic bag.    Place a towel between your skin and the bag.    Leave the ice on for 20 minutes, 2-3 times a day.   Ask your doctor if you should wear an elastic knee support.   Sleep with a pillow under your knee.   Lose weight if you are overweight. Being overweight can make your knee hurt more.   Do not use any tobacco products, including cigarettes, chewing tobacco, or electronic cigarettes. If you need help quitting, ask your doctor. Smoking may slow the healing of any bone and joint problems that you may have.  GET HELP IF:   Your knee pain does not stop, it changes, or it gets worse.   You have a fever along with knee pain.   Your knee gives out or locks up.   Your knee becomes more swollen.  GET HELP RIGHT AWAY IF:    Your knee feels hot to the touch.   You have chest pain or trouble breathing.     This information is not intended to replace advice given to you by your health care provider. Make sure you discuss any questions you have with your health care provider.     Document Released: 01/24/2009 Document Revised: 11/18/2014 Document Reviewed: 12/29/2013  Elsevier Interactive Patient Education 2016 [employer]

## 2015-11-30 NOTE — ED Notes (Signed)
Mother states that pt has been having increased right knee pain from baseline.  Had therapy yesterday and the cold weather seems to have bothered it.

## 2015-12-01 NOTE — ED Notes (Signed)
Patient called stating he received a prepack of six Oxycodones yesterday which was not helping with his chronic right knee pain. I spoke with Burgess Amor and Mesner, MD. Idol pulled the patients chart, instructions to use Motrin or Naproxen for pain and to follow up with his doctor in [place] per discharge orders. I called the patient back and relied instructions.

## 2015-12-03 ENCOUNTER — Encounter (HOSPITAL_COMMUNITY): Payer: Self-pay | Admitting: Cardiology

## 2015-12-03 ENCOUNTER — Emergency Department (HOSPITAL_COMMUNITY)
Admission: EM | Admit: 2015-12-03 | Discharge: 2015-12-03 | Disposition: A | Discharge status: Home / Self Care | Payer: BLUE CROSS/BLUE SHIELD | Attending: Emergency Medicine | Admitting: Emergency Medicine

## 2015-12-03 DIAGNOSIS — M25561 Pain in right knee: Secondary | ICD-10-CM | POA: Diagnosis present

## 2015-12-03 DIAGNOSIS — G8929 Other chronic pain: Secondary | ICD-10-CM | POA: Insufficient documentation

## 2015-12-03 DIAGNOSIS — F419 Anxiety disorder, unspecified: Secondary | ICD-10-CM | POA: Insufficient documentation

## 2015-12-03 DIAGNOSIS — Z79899 Other long term (current) drug therapy: Secondary | ICD-10-CM | POA: Insufficient documentation

## 2015-12-03 DIAGNOSIS — F17210 Nicotine dependence, cigarettes, uncomplicated: Secondary | ICD-10-CM | POA: Diagnosis not present

## 2015-12-03 DIAGNOSIS — F329 Major depressive disorder, single episode, unspecified: Secondary | ICD-10-CM | POA: Diagnosis not present

## 2015-12-03 MED ORDER — OXYCODONE-ACETAMINOPHEN 5-325 MG PO TABS: 1.0000 | ORAL_TABLET | Freq: Four times a day (QID) | ORAL | Status: AC | PRN

## 2015-12-03 MED ORDER — KETOROLAC TROMETHAMINE 60 MG/2ML IM SOLN
60.0000 mg | Freq: Once | INTRAMUSCULAR | Status: AC
  Administered 2015-12-03: 60 mg via INTRAMUSCULAR
  Filled 2015-12-03: qty 2

## 2015-12-03 MED ORDER — DEXAMETHASONE SODIUM PHOSPHATE 4 MG/ML IJ SOLN
8.0000 mg | Freq: Once | INTRAMUSCULAR | Status: AC
  Administered 2015-12-03: 8 mg via INTRAMUSCULAR
  Filled 2015-12-03: qty 2

## 2015-12-03 NOTE — ED Notes (Signed)
Pt c/o increasing pain from "flare up" in his right knee that radiates into right heel if he puts weight on it. Pt has chronic pain issues with this leg for the past 5 years. Pt was seen at [facility] on Thursday and given 6 tablets of Percocet which helped, but he is now out of them. Pt also given crutches at that time and is currently using them to help with ambulation. Pt's mother at bedside reports pt has these flare ups periodically and normally they last for 2-3 days, but due to the weather this flare up is lasting longer than normal.

## 2015-12-03 NOTE — ED Notes (Signed)
Has chronic right  knee pain with flare ups.  Seen Thursday for same and pain is no better.

## 2015-12-03 NOTE — ED Provider Notes (Signed)
CSN: [id number]     Arrival date & time 12/03/15  1002 History  By signing my name below, I, [facility], attest that this documentation has been prepared under the direction and in the presence of Johnathan Quale, PA-C. Electronically Signed: Randell Lane, ED Scribe. 12/03/2015. 1:40 PM.   Chief Complaint  Lane presents with  . Knee Pain    The history is provided by the Lane, medical records and a parent. No language interpreter was used.   HPI Comments: Johnathan Lane is a 20 y.o. male with an hx of chronic knee pain, patellar tendonitis, Osgood-Schlatter's disease, and complex regional pain syndrome who presents to the Emergency Department complaining of ongoing, moderate, right knee pain, worse over the past 3 days. Per medical records, Lane was seen 3 days ago in the [facility] ED by Pauline Aus, PA-C for similar symptoms and received a Toradol injection and was discharged home with 6x oxycodone. He endorses difficulty ambulating normally secondary to pain. Pain is worse with rainy, cold weather. He has taken oxycodone with relief of pain but notes that he has since run out of this medication. Mother notes that Lane is in physical therapy and has been improving over the past few months but had a flare-up 1 week ago for which she has not been able to reach the Lane's specialist despite calling the office multiple times. He denies any other symptoms currently.  Past Medical History  Diagnosis Date  . Osgood-Schlatter's disease   . Depression   . Anxiety   . Patellar tendonitis   . Chronic knee pain   . Complex regional pain syndrome    Past Surgical History  Procedure Laterality Date  . Wisdom tooth extraction     Family History  Problem Relation Age of Onset  . Diabetes Father   . ADD / ADHD Father   . Bipolar disorder Father   . Anxiety disorder Father   . Alcohol abuse Father   . Alcohol abuse Maternal Grandfather   . Anxiety disorder  Maternal Grandfather   . Depression Maternal Grandfather   . Dementia Neg Hx   . Drug abuse Neg Hx   . OCD Neg Hx   . Paranoid behavior Neg Hx   . Schizophrenia Neg Hx   . Seizures Neg Hx   . Sexual abuse Neg Hx   . Physical abuse Neg Hx    Social History  Substance Use Topics  . Smoking status: Current Some Day Smoker    Types: Cigarettes  . Smokeless tobacco: Never Used  . Alcohol Use: No    Review of Systems  Musculoskeletal: Positive for arthralgias (Right knee).  All other systems reviewed and are negative.     Allergies  Risperdal and Indocin  Home Medications   Prior to Admission medications   Medication Sig Start Date End Date Taking? Authorizing Provider  amphetamine-dextroamphetamine (ADDERALL) 10 MG tablet Take 10 mg by mouth daily. 11/15/15  Yes Historical Provider, MD  busPIRone (BUSPAR) 30 MG tablet Take 30 mg by mouth 2 (two) times daily.   Yes Historical Provider, MD  Cholecalciferol (VITAMIN D PO) Take 1 capsule by mouth daily.   Yes Historical Provider, MD  DULoxetine (CYMBALTA) 60 MG capsule Take 120 mg by mouth daily.    Yes Historical Provider, MD  HYDROcodone-acetaminophen (NORCO/VICODIN) 5-325 MG tablet Take 1-2 tablets by mouth every 4 (four) hours as needed. Lane taking differently: Take 1-2 tablets by mouth every 4 (four) hours as needed for  moderate pain.  08/14/15  Yes Johnathan Quale, PA-C  hydrocortisone cream 1 % Apply 1 application topically as needed for itching (only when having Physical Therapy).   Yes Historical Provider, MD  hydrOXYzine (ATARAX/VISTARIL) 25 MG tablet Take 25 mg by mouth 3 (three) times daily as needed. for anxiety 11/16/15  Yes Historical Provider, MD  ibuprofen (ADVIL,MOTRIN) 200 MG tablet Take 800 mg by mouth every 6 (six) hours as needed for moderate pain.   Yes Historical Provider, MD  Melatonin 5 MG TABS Take 1 tablet by mouth at bedtime as needed (sleep).   Yes Historical Provider, MD  OLANZapine (ZYPREXA) 10 MG tablet  Take 10 mg by mouth at bedtime.   Yes Historical Provider, MD  oxymetazoline (AFRIN) 0.05 % nasal spray Place 1 spray into both nostrils daily as needed for congestion.   Yes Historical Provider, MD  cephALEXin (KEFLEX) 500 MG capsule Take 1 capsule (500 mg total) by mouth 4 (four) times daily. Lane not taking: Reported on 07/19/2015 06/20/15   Teressa Lower, NP  metoCLOPramide (REGLAN) 10 MG tablet Take 1 tablet (10 mg total) by mouth every 6 (six) hours as needed for nausea (or headache). Lane not taking: Reported on 08/14/2015 07/19/15   Samuel Jester, DO  oxyCODONE-acetaminophen (PERCOCET/ROXICET) 5-325 MG tablet Take 1 tablet by mouth every 4 (four) hours as needed. Lane not taking: Reported on 12/03/2015 11/30/15   Tammy Triplett, PA-C   BP 138/73 mmHg  Pulse 111  Temp(Src) 98 F (36.7 C) (Oral)  Resp 18  Ht 6' (1.829 m)  Wt 180 lb (81.647 kg)  BMI 24.41 kg/m2  SpO2 95% Physical Exam  Constitutional: He is oriented to person, place, and time. He appears well-developed and well-nourished. No distress.  HENT:  Head: Normocephalic and atraumatic.  Eyes: Conjunctivae and EOM are normal.  Neck: Neck supple. No tracheal deviation present.  Cardiovascular: Normal rate.   Pulmonary/Chest: Effort normal. No respiratory distress.  Musculoskeletal: Normal range of motion.  No effusion of right knee. No hematoma of the quadricep. No hematoma of the right patella tendon and the anterior tuberosity is not hot. No posterior mass. Some tightness with flexion. Patella is midline. Mild crepitus present. No palpable inguinal lymphadenopathy.  Neurological: He is alert and oriented to person, place, and time.  Skin: Skin is warm and dry.  Psychiatric: He has a normal mood and affect. His behavior is normal.  Nursing note and vitals reviewed.   ED Course  Procedures   DIAGNOSTIC STUDIES: Oxygen Saturation is 95% on RA, adequate by my interpretation.    COORDINATION OF CARE: 1:40 PM  Will order Decadron and Toradol. Will prescribe Percocet. Discussed treatment plan with pt at bedside and pt agreed to plan.   Labs Review Labs Reviewed - No data to display  Imaging Review No results found. I have personally reviewed and evaluated these images and lab results as part of my medical decision-making.   EKG Interpretation None      MDM Vital signs have been reviewed. Is no evidence of hot joint. Is no dislocation noted. The examination favors exacerbation of the chronic right knee pain. The Lane is in the midst of rehabilitation. The Lane reports pain following his rehabilitation session alone. The Lane was treated with Decadron and Toradol in the emergency department. He request a to go pack of Percocet. He states he receive the symphysis last visit, and is not able to see his physician over the next few days.    Final  diagnoses:  None    **I have reviewed nursing notes, vital signs, and all appropriate lab and imaging results for this Lane.*  **I personally performed the services described in this documentation, which was scribed in my presence. The recorded information has been reviewed and is accurate.Johnathan Quale, PA-C 12/05/15 1832  Donnetta Hutching, MD 12/06/15 [phone]

## 2015-12-03 NOTE — ED Notes (Signed)
Percocet 6 tablets pre-packaged given to patient from pyxis per Ivery Quale, PA order.

## 2015-12-03 NOTE — Discharge Instructions (Signed)
Please discuss your breakthrough flareups with your primary physician. When taking Percocet, please do not drive, drink alcohol, operative machinery, or participate in  Knee Pain Knee pain is a common problem. It can have many causes. The pain often goes away by following your doctor's home care instructions. Treatment for ongoing pain will depend on the cause of your pain. If your knee pain continues, more tests may be needed to diagnose your condition. Tests may include X-rays or other imaging studies of your knee. HOME CARE  Take medicines only as told by your doctor.  Rest your knee and keep it raised (elevated) while you are resting.  Do not do things that cause pain or make your pain worse.  Avoid activities where both feet leave the ground at the same time, such as running, jumping rope, or doing jumping jacks.  Apply ice to the knee area:  Put ice in a plastic bag.  Place a towel between your skin and the bag.  Leave the ice on for 20 minutes, 2-3 times a day.  Ask your doctor if you should wear an elastic knee support.  Sleep with a pillow under your knee.  Lose weight if you are overweight. Being overweight can make your knee hurt more.  Do not use any tobacco products, including cigarettes, chewing tobacco, or electronic cigarettes. If you need help quitting, ask your doctor. Smoking may slow the healing of any bone and joint problems that you may have. GET HELP IF:  Your knee pain does not stop, it changes, or it gets worse.  You have a fever along with knee pain.  Your knee gives out or locks up.  Your knee becomes more swollen. GET HELP RIGHT AWAY IF:   Your knee feels hot to the touch.  You have chest pain or trouble breathing.   This information is not intended to replace advice given to you by your health care provider. Make sure you discuss any questions you have with your health care provider.   Document Released: 01/24/2009 Document Revised: 11/18/2014  Document Reviewed: 12/29/2013 Elsevier Interactive Patient Education [employer]  activities requiring concentration.

## 2015-12-04 MED FILL — Oxycodone w/ Acetaminophen Tab 5-325 MG: ORAL | Qty: 6 | Status: AC

## 2015-12-05 NOTE — ED Provider Notes (Signed)
CSN: [id number]     Arrival date & time 11/30/15  1632 History   First MD Initiated Contact with Patient 11/30/15 1702     Chief Complaint  Patient presents with  . Knee Pain     (Consider location/radiation/quality/duration/timing/severity/associated sxs/prior Treatment) HPI   Johnathan Lane is a 20 y.o. male with hx of chronic right knee pain, presents to the Emergency Department complaining of increased knee pain after doing PT on the day prior to arrival.  He states he takes vicodin for pain and it usually helps, but not since taking therapy.  He denies known injury, fever, chills, swelling of the joint, and redness.    Past Medical History  Diagnosis Date  . Osgood-Schlatter's disease   . Depression   . Anxiety   . Patellar tendonitis   . Chronic knee pain   . Complex regional pain syndrome    Past Surgical History  Procedure Laterality Date  . Wisdom tooth extraction     Family History  Problem Relation Age of Onset  . Diabetes Father   . ADD / ADHD Father   . Bipolar disorder Father   . Anxiety disorder Father   . Alcohol abuse Father   . Alcohol abuse Maternal Grandfather   . Anxiety disorder Maternal Grandfather   . Depression Maternal Grandfather   . Dementia Neg Hx   . Drug abuse Neg Hx   . OCD Neg Hx   . Paranoid behavior Neg Hx   . Schizophrenia Neg Hx   . Seizures Neg Hx   . Sexual abuse Neg Hx   . Physical abuse Neg Hx    Social History  Substance Use Topics  . Smoking status: Current Some Day Smoker    Types: Cigarettes  . Smokeless tobacco: Never Used  . Alcohol Use: No    Review of Systems  Constitutional: Negative for fever and chills.  Gastrointestinal: Negative for vomiting.  Musculoskeletal: Positive for arthralgias (right knee pain). Negative for back pain and joint swelling.  Skin: Negative for color change and wound.  All other systems reviewed and are negative.     Allergies  Risperdal and Indocin  Home Medications    Prior to Admission medications   Medication Sig Start Date End Date Taking? Authorizing Provider  amphetamine-dextroamphetamine (ADDERALL) 10 MG tablet Take 10 mg by mouth daily. 11/15/15  Yes Historical Provider, MD  busPIRone (BUSPAR) 30 MG tablet Take 30 mg by mouth 2 (two) times daily.   Yes Historical Provider, MD  DULoxetine (CYMBALTA) 60 MG capsule Take 120 mg by mouth daily.    Yes Historical Provider, MD  HYDROcodone-acetaminophen (NORCO/VICODIN) 5-325 MG tablet Take 1-2 tablets by mouth every 4 (four) hours as needed. Patient taking differently: Take 1-2 tablets by mouth every 4 (four) hours as needed for moderate pain.  08/14/15  Yes Ivery Quale, PA-C  OLANZapine (ZYPREXA) 10 MG tablet Take 10 mg by mouth at bedtime.   Yes Historical Provider, MD  cephALEXin (KEFLEX) 500 MG capsule Take 1 capsule (500 mg total) by mouth 4 (four) times daily. Patient not taking: Reported on 07/19/2015 06/20/15   Teressa Lower, NP  Cholecalciferol (VITAMIN D PO) Take 1 capsule by mouth daily.    Historical Provider, MD  hydrocortisone cream 1 % Apply 1 application topically as needed for itching (only when having Physical Therapy).    Historical Provider, MD  hydrOXYzine (ATARAX/VISTARIL) 25 MG tablet Take 25 mg by mouth 3 (three) times daily as needed. for anxiety  11/16/15   Historical Provider, MD  ibuprofen (ADVIL,MOTRIN) 200 MG tablet Take 800 mg by mouth every 6 (six) hours as needed for moderate pain.    Historical Provider, MD  Melatonin 5 MG TABS Take 1 tablet by mouth at bedtime as needed (sleep).    Historical Provider, MD  metoCLOPramide (REGLAN) 10 MG tablet Take 1 tablet (10 mg total) by mouth every 6 (six) hours as needed for nausea (or headache). Patient not taking: Reported on 08/14/2015 07/19/15   Samuel Jester, DO  oxyCODONE-acetaminophen (PERCOCET/ROXICET) 5-325 MG tablet Take 1-2 tablets by mouth every 6 (six) hours as needed for severe pain. 12/03/15   Ivery Quale, PA-C  oxymetazoline  (AFRIN) 0.05 % nasal spray Place 1 spray into both nostrils daily as needed for congestion.    Historical Provider, MD   BP 132/79 mmHg  Pulse 93  Temp(Src) 97.8 F (36.6 C) (Temporal)  Resp 16  Ht 6' (1.829 m)  Wt 81.647 kg  BMI 24.41 kg/m2  SpO2 95% Physical Exam  Constitutional: He is oriented to person, place, and time. He appears well-developed and well-nourished. No distress.  Cardiovascular: Normal rate, regular rhythm, normal heart sounds and intact distal pulses.   Pulmonary/Chest: Effort normal and breath sounds normal.  Musculoskeletal: He exhibits tenderness.  ttp of the anterior right knee.  No erythema, effusion, or step-off deformity.  DP pulse brisk, distal sensation intact. Calf is soft and NT.  Mild patellar crepitus.  Pain with full flexion.   Neurological: He is alert and oriented to person, place, and time. He exhibits normal muscle tone. Coordination normal.  Skin: Skin is warm and dry. No erythema.  Nursing note and vitals reviewed.   ED Course  Procedures (including critical care time) Labs Review Labs Reviewed - No data to display  Imaging Review No results found. I have personally reviewed and evaluated these images and lab results as part of my medical decision-making.   EKG Interpretation None      MDM   Final diagnoses:  Knee pain, chronic, right    Pt with multiple ED visits for chronic knee pain.  Pt is in a pain management clinic in [place],  No concerning sx's on this visit for septic joint.  Pt able to flex/extend the joint.  No effusion, NV intact.  Compartments are soft.  Pt currently taking vicodin, I will dispense #6 percocet pre-pack for current pain and he agrees to f/u with his pain management doctor.      Pauline Aus, PA-C 12/05/15 1554  Bethann Berkshire, MD 12/06/15 [phone]

## 2015-12-10 ENCOUNTER — Encounter (HOSPITAL_COMMUNITY): Payer: Self-pay | Admitting: *Deleted

## 2015-12-10 ENCOUNTER — Emergency Department (HOSPITAL_COMMUNITY)
Admission: EM | Admit: 2015-12-10 | Discharge: 2015-12-10 | Disposition: A | Discharge status: Home / Self Care | Payer: BLUE CROSS/BLUE SHIELD | Attending: Emergency Medicine | Admitting: Emergency Medicine

## 2015-12-10 DIAGNOSIS — M25461 Effusion, right knee: Secondary | ICD-10-CM | POA: Diagnosis not present

## 2015-12-10 DIAGNOSIS — M25561 Pain in right knee: Secondary | ICD-10-CM | POA: Insufficient documentation

## 2015-12-10 DIAGNOSIS — Z792 Long term (current) use of antibiotics: Secondary | ICD-10-CM | POA: Insufficient documentation

## 2015-12-10 DIAGNOSIS — F419 Anxiety disorder, unspecified: Secondary | ICD-10-CM | POA: Diagnosis not present

## 2015-12-10 DIAGNOSIS — F329 Major depressive disorder, single episode, unspecified: Secondary | ICD-10-CM | POA: Diagnosis not present

## 2015-12-10 DIAGNOSIS — G8929 Other chronic pain: Secondary | ICD-10-CM | POA: Insufficient documentation

## 2015-12-10 DIAGNOSIS — Z79899 Other long term (current) drug therapy: Secondary | ICD-10-CM | POA: Diagnosis not present

## 2015-12-10 DIAGNOSIS — R110 Nausea: Secondary | ICD-10-CM | POA: Insufficient documentation

## 2015-12-10 DIAGNOSIS — F17210 Nicotine dependence, cigarettes, uncomplicated: Secondary | ICD-10-CM | POA: Diagnosis not present

## 2015-12-10 MED ORDER — OXYCODONE-ACETAMINOPHEN 5-325 MG PO TABS
2.0000 | ORAL_TABLET | Freq: Once | ORAL | Status: AC
  Administered 2015-12-10: 2 via ORAL
  Filled 2015-12-10: qty 2

## 2015-12-10 NOTE — ED Provider Notes (Signed)
CSN: [id number]     Arrival date & time 12/10/15  1807 History  By signing my name below, I, Doreatha Martin, attest that this documentation has been prepared under the direction and in the presence of Burgess Amor, PA-C. Electronically Signed: Doreatha Martin, ED Scribe. 12/10/2015. 8:05 PM.     Chief Complaint  Patient presents with  . Knee Pain   The history is provided by the patient. No language interpreter was used.    HPI Comments: Johnathan Lane is a 20 y.o. male with h/o Osgood-Schlatter's disease, patellar tendonitis, swollen plica, chronic knee pain, complex regional pain syndrome, ligament laxity who presents to the Emergency Department complaining of acute on chronic right knee pain and swelling onset last night with associated nausea secondary to pain, decreased ROM secondary to pain, erythema to the knee. Pt had his first visit with an orthopedist 3 days ago and received a cortisone shot. Per mother, he did not use the leg the next day. 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 He states that pain is worsened with movement, flexion, extension, ambulation, weight bearing. Mother notes that the pt is currently attending PT. He normally takes 5 mg Oxycodone for pain, but has also been taking 800 mg q4h ibuprofen today. He notes mild to moderate relief with this treatment. Pt has been seen in the ED multiple times in the last week for similar complaints with a negative work up. He is followed by orthopedist Dr. Virgel Gess in [place]. He is also followed by pain management. Pts last MRI was last year. He denies numbness, focal weakness, paresthesia, fever, emesis. Mother states his knee was very red and warm this am (has a picture on her phone which confirms this) which has improved.  Past Medical History  Diagnosis Date  . Osgood-Schlatter's disease   . Depression   . Anxiety   . Patellar tendonitis   . Chronic knee pain   . Complex  regional pain syndrome    Past Surgical History  Procedure Laterality Date  . Wisdom tooth extraction     Family History  Problem Relation Age of Onset  . Diabetes Father   . ADD / ADHD Father   . Bipolar disorder Father   . Anxiety disorder Father   . Alcohol abuse Father   . Alcohol abuse Maternal Grandfather   . Anxiety disorder Maternal Grandfather   . Depression Maternal Grandfather   . Dementia Neg Hx   . Drug abuse Neg Hx   . OCD Neg Hx   . Paranoid behavior Neg Hx   . Schizophrenia Neg Hx   . Seizures Neg Hx   . Sexual abuse Neg Hx   . Physical abuse Neg Hx    Social History  Substance Use Topics  . Smoking status: Current Some Day Smoker    Types: Cigarettes  . Smokeless tobacco: Never Used  . Alcohol Use: No    Review of Systems  Constitutional: Negative for fever.  Gastrointestinal: Positive for nausea. Negative for vomiting.  Musculoskeletal: Positive for joint swelling and arthralgias.  Skin: Positive for color change.  Neurological: Negative for weakness and numbness.   Allergies  Risperdal and Indocin  Home Medications   Prior to Admission medications   Medication Sig Start Date End Date Taking? Authorizing Provider  amphetamine-dextroamphetamine (ADDERALL) 10 MG tablet Take 10 mg by mouth daily. 11/15/15   Historical Provider, MD  busPIRone (BUSPAR) 30 MG tablet Take 30  mg by mouth 2 (two) times daily.    Historical Provider, MD  cephALEXin (KEFLEX) 500 MG capsule Take 1 capsule (500 mg total) by mouth 4 (four) times daily. Patient not taking: Reported on 07/19/2015 06/20/15   Teressa Lower, NP  Cholecalciferol (VITAMIN D PO) Take 1 capsule by mouth daily.    Historical Provider, MD  DULoxetine (CYMBALTA) 60 MG capsule Take 120 mg by mouth daily.     Historical Provider, MD  HYDROcodone-acetaminophen (NORCO/VICODIN) 5-325 MG tablet Take 1-2 tablets by mouth every 4 (four) hours as needed. Patient taking differently: Take 1-2 tablets by mouth every 4  (four) hours as needed for moderate pain.  08/14/15   Ivery Quale, PA-C  hydrocortisone cream 1 % Apply 1 application topically as needed for itching (only when having Physical Therapy).    Historical Provider, MD  hydrOXYzine (ATARAX/VISTARIL) 25 MG tablet Take 25 mg by mouth 3 (three) times daily as needed. for anxiety 11/16/15   Historical Provider, MD  ibuprofen (ADVIL,MOTRIN) 200 MG tablet Take 800 mg by mouth every 6 (six) hours as needed for moderate pain.    Historical Provider, MD  Melatonin 5 MG TABS Take 1 tablet by mouth at bedtime as needed (sleep).    Historical Provider, MD  metoCLOPramide (REGLAN) 10 MG tablet Take 1 tablet (10 mg total) by mouth every 6 (six) hours as needed for nausea (or headache). Patient not taking: Reported on 08/14/2015 07/19/15   Samuel Jester, DO  OLANZapine (ZYPREXA) 10 MG tablet Take 10 mg by mouth at bedtime.    Historical Provider, MD  oxyCODONE-acetaminophen (PERCOCET/ROXICET) 5-325 MG tablet Take 1-2 tablets by mouth every 6 (six) hours as needed for severe pain. 12/03/15   Ivery Quale, PA-C  oxymetazoline (AFRIN) 0.05 % nasal spray Place 1 spray into both nostrils daily as needed for congestion.    Historical Provider, MD   BP 112/65 mmHg  Pulse 92  Temp(Src) 97.8 F (36.6 C) (Oral)  Resp 16  Ht 6' (1.829 m)  Wt 81.647 kg  BMI 24.41 kg/m2  SpO2 100% Physical Exam  Constitutional: He is oriented to person, place, and time. He appears well-developed and well-nourished.  HENT:  Head: Normocephalic and atraumatic.  Eyes: Conjunctivae and EOM are normal. Pupils are equal, round, and reactive to light.  Neck: Normal range of motion. Neck supple.  Cardiovascular: Normal rate.   Pulmonary/Chest: Effort normal. No respiratory distress.  Abdominal: He exhibits no distension.  Musculoskeletal: Normal range of motion. He exhibits edema and tenderness.  No crepitus or deformity. There is moderate edema of the right knee, small effusion. No obvious  ligament instability. DPs intact. No erythema, no increased warmth.  Pt can flex and extend the knee although not fully secondary to pain.  Neurological: He is alert and oriented to person, place, and time.  Skin: Skin is warm and dry.  Psychiatric: He has a normal mood and affect. His behavior is normal.  Nursing note and vitals reviewed.  ED Course  Procedures (including critical care time) DIAGNOSTIC STUDIES: Oxygen Saturation is 100% on RA, normal by my interpretation.    COORDINATION OF CARE: 8:00 PM Discussed treatment plan with pt at bedside and pt agreed to plan.   Labs Review Labs Reviewed - No data to display  Imaging Review No results found. I have personally reviewed and evaluated these images and lab results as part of my medical decision-making.  MDM   Final diagnoses:  Chronic knee pain, right   Pt  presents to the ED for evaluation of acute on chronic right knee pain. Exam not consistent with septic joint.  Pt advised to follow up with orthopedics. Conservative therapy recommended and discussed. Plan f/u with ortho and/or pain management.   Patient will be discharged home & is agreeable with above plan. Returns precautions discussed.  Suggested to increase his oxycodone to q 4 hours for the next several days for better pain control.    Pt was seen by Dr. Juleen China during this visit.  I personally performed the services described in this documentation, which was scribed in my presence. The recorded information has been reviewed and is accurate.   Burgess Amor, PA-C 12/10/15 2140  Raeford Razor, MD 12/13/15 1247

## 2015-12-10 NOTE — Discharge Instructions (Signed)
Knee Pain Knee pain is a very common symptom and can have many causes. Knee pain often goes away when you follow your health care provider's instructions for relieving pain and discomfort at home. However, knee pain can develop into a condition that needs treatment. Some conditions may include:  Arthritis caused by wear and tear (osteoarthritis).  Arthritis caused by swelling and irritation (rheumatoid arthritis or gout).  A cyst or growth in your knee.  An infection in your knee joint.  An injury that will not heal.  Damage, swelling, or irritation of the tissues that support your knee (torn ligaments or tendinitis). If your knee pain continues, additional tests may be ordered to diagnose your condition. Tests may include X-rays or other imaging studies of your knee. You may also need to have fluid removed from your knee. Treatment for ongoing knee pain depends on the cause, but treatment may include:  Medicines to relieve pain or swelling.  Steroid injections in your knee.  Physical therapy.  Surgery. HOME CARE INSTRUCTIONS  Take medicines only as directed by your health care provider.  Rest your knee and keep it raised (elevated) while you are resting.  Do not do things that cause or worsen pain.  Avoid high-impact activities or exercises, such as running, jumping rope, or doing jumping jacks.  Apply ice to the knee area:  Put ice in a plastic bag.  Place a towel between your skin and the bag.  Leave the ice on for 20 minutes, 2-3 times a day.  Ask your health care provider if you should wear an elastic knee support.  Keep a pillow under your knee when you sleep.  Lose weight if you are overweight. Extra weight can put pressure on your knee.  Do not use any tobacco products, including cigarettes, chewing tobacco, or electronic cigarettes. If you need help quitting, ask your health care provider. Smoking may slow the healing of any bone and joint problems that you may  have. SEEK MEDICAL CARE IF:  Your knee pain continues, changes, or gets worse.  You have a fever along with knee pain.  Your knee buckles or locks up.  Your knee becomes more swollen. SEEK IMMEDIATE MEDICAL CARE IF:   Your knee joint feels hot to the touch.  You have chest pain or trouble breathing.   This information is not intended to replace advice given to you by your health care provider. Make sure you discuss any questions you have with your health care provider.   Document Released: 08/25/2007 Document Revised: 11/18/2014 Document Reviewed: 06/13/2014 Elsevier Interactive Patient Education [employer]   As discussed, we recommend you increasing your oxycodone tablet to 1-2 tablets every 4 hours for the next 3 days only if needed for increased pain, then back down to your regular prescription instructions.

## 2015-12-10 NOTE — ED Notes (Signed)
Pt went to ortho on Thursday and received a cortisone shot on Thursday. Pt reports he stayed off leg for 24 hrs. Worked last night and knee pain increased and now is swollen.Mother reports knee was red last night

## 2016-01-24 ENCOUNTER — Encounter (HOSPITAL_COMMUNITY): Payer: Self-pay

## 2016-01-24 ENCOUNTER — Emergency Department (HOSPITAL_COMMUNITY)
Admission: EM | Admit: 2016-01-24 | Discharge: 2016-01-24 | Disposition: A | Discharge status: Home / Self Care | Payer: BLUE CROSS/BLUE SHIELD | Attending: Emergency Medicine | Admitting: Emergency Medicine

## 2016-01-24 DIAGNOSIS — Z79899 Other long term (current) drug therapy: Secondary | ICD-10-CM | POA: Insufficient documentation

## 2016-01-24 DIAGNOSIS — IMO0002 Reserved for concepts with insufficient information to code with codable children: Secondary | ICD-10-CM

## 2016-01-24 DIAGNOSIS — G90521 Complex regional pain syndrome I of right lower limb: Secondary | ICD-10-CM | POA: Diagnosis not present

## 2016-01-24 DIAGNOSIS — F329 Major depressive disorder, single episode, unspecified: Secondary | ICD-10-CM | POA: Diagnosis not present

## 2016-01-24 DIAGNOSIS — F17210 Nicotine dependence, cigarettes, uncomplicated: Secondary | ICD-10-CM | POA: Diagnosis not present

## 2016-01-24 DIAGNOSIS — Z792 Long term (current) use of antibiotics: Secondary | ICD-10-CM | POA: Diagnosis not present

## 2016-01-24 DIAGNOSIS — M25561 Pain in right knee: Secondary | ICD-10-CM | POA: Diagnosis present

## 2016-01-24 MED ORDER — KETOROLAC TROMETHAMINE 60 MG/2ML IM SOLN
60.0000 mg | Freq: Once | INTRAMUSCULAR | Status: AC
Start: 2016-01-24 — End: 2016-01-24
  Administered 2016-01-24: 60 mg via INTRAMUSCULAR
  Filled 2016-01-24: qty 2

## 2016-01-24 MED ORDER — ONDANSETRON HCL 4 MG PO TABS
4.0000 mg | ORAL_TABLET | Freq: Once | ORAL | Status: AC
  Administered 2016-01-24: 4 mg via ORAL
  Filled 2016-01-24: qty 1

## 2016-01-24 MED ORDER — DEXAMETHASONE SODIUM PHOSPHATE 4 MG/ML IJ SOLN
8.0000 mg | Freq: Once | INTRAMUSCULAR | Status: AC
  Administered 2016-01-24: 8 mg via INTRAMUSCULAR
  Filled 2016-01-24: qty 2

## 2016-01-24 MED ORDER — HYDROCODONE-ACETAMINOPHEN 5-325 MG PO TABS
2.0000 | ORAL_TABLET | Freq: Once | ORAL | Status: AC
  Administered 2016-01-24: 2 via ORAL
  Filled 2016-01-24: qty 2

## 2016-01-24 NOTE — ED Provider Notes (Signed)
CSN: [id number]     Arrival date & time 01/24/16  1221 History   First MD Initiated Contact with Patient 01/24/16 1438     Chief Complaint  Patient presents with  . Knee Pain     (Consider location/radiation/quality/duration/timing/severity/associated sxs/prior Treatment) Patient is a 20 y.o. male presenting with knee pain. The history is provided by the patient and a parent.  Knee Pain Location:  Knee Knee location:  R knee Pain details:    Quality:  Sharp   Severity:  Moderate   Onset quality:  Gradual   Duration:  3 days   Timing:  Intermittent   Progression:  Worsening Chronicity:  Chronic Prior injury to area:  No Relieved by:  Nothing Worsened by:  Bearing weight, extension and flexion Associated symptoms: decreased ROM   Risk factors: no recent illness   Risk factors comment:  History of Complex regional knee pain.   Past Medical History  Diagnosis Date  . Osgood-Schlatter's disease   . Depression   . Anxiety   . Patellar tendonitis   . Chronic knee pain   . Complex regional pain syndrome    Past Surgical History  Procedure Laterality Date  . Wisdom tooth extraction     Family History  Problem Relation Age of Onset  . Diabetes Father   . ADD / ADHD Father   . Bipolar disorder Father   . Anxiety disorder Father   . Alcohol abuse Father   . Alcohol abuse Maternal Grandfather   . Anxiety disorder Maternal Grandfather   . Depression Maternal Grandfather   . Dementia Neg Hx   . Drug abuse Neg Hx   . OCD Neg Hx   . Paranoid behavior Neg Hx   . Schizophrenia Neg Hx   . Seizures Neg Hx   . Sexual abuse Neg Hx   . Physical abuse Neg Hx    Social History  Substance Use Topics  . Smoking status: Current Some Day Smoker    Types: Cigarettes  . Smokeless tobacco: Never Used  . Alcohol Use: No    Review of Systems  Musculoskeletal: Positive for arthralgias.  All other systems reviewed and are negative.     Allergies  Risperdal and Indocin  Home  Medications   Prior to Admission medications   Medication Sig Start Date End Date Taking? Authorizing Provider  amphetamine-dextroamphetamine (ADDERALL) 10 MG tablet Take 10 mg by mouth daily. 11/15/15   Historical Provider, MD  busPIRone (BUSPAR) 30 MG tablet Take 30 mg by mouth 2 (two) times daily.    Historical Provider, MD  cephALEXin (KEFLEX) 500 MG capsule Take 1 capsule (500 mg total) by mouth 4 (four) times daily. Patient not taking: Reported on 07/19/2015 06/20/15   Teressa Lower, NP  Cholecalciferol (VITAMIN D PO) Take 1 capsule by mouth daily.    Historical Provider, MD  DULoxetine (CYMBALTA) 60 MG capsule Take 120 mg by mouth daily.     Historical Provider, MD  HYDROcodone-acetaminophen (NORCO/VICODIN) 5-325 MG tablet Take 1-2 tablets by mouth every 4 (four) hours as needed. Patient taking differently: Take 1-2 tablets by mouth every 4 (four) hours as needed for moderate pain.  08/14/15   Ivery Quale, PA-C  hydrocortisone cream 1 % Apply 1 application topically as needed for itching (only when having Physical Therapy).    Historical Provider, MD  hydrOXYzine (ATARAX/VISTARIL) 25 MG tablet Take 25 mg by mouth 3 (three) times daily as needed. for anxiety 11/16/15   Historical Provider, MD  ibuprofen (ADVIL,MOTRIN) 200 MG tablet Take 800 mg by mouth every 6 (six) hours as needed for moderate pain.    Historical Provider, MD  Melatonin 5 MG TABS Take 1 tablet by mouth at bedtime as needed (sleep).    Historical Provider, MD  metoCLOPramide (REGLAN) 10 MG tablet Take 1 tablet (10 mg total) by mouth every 6 (six) hours as needed for nausea (or headache). Patient not taking: Reported on 08/14/2015 07/19/15   Samuel JesterKathleen McManus, DO  OLANZapine (ZYPREXA) 10 MG tablet Take 10 mg by mouth at bedtime.    Historical Provider, MD  oxyCODONE-acetaminophen (PERCOCET/ROXICET) 5-325 MG tablet Take 1-2 tablets by mouth every 6 (six) hours as needed for severe pain. 12/03/15   Ivery QualeHobson Deeann Servidio, PA-C  oxymetazoline  (AFRIN) 0.05 % nasal spray Place 1 spray into both nostrils daily as needed for congestion.    Historical Provider, MD   BP 141/81 mmHg  Pulse 108  Temp(Src) 97.6 F (36.4 C) (Oral)  Resp 18  Ht 6' (1.829 m)  Wt 81.647 kg  BMI 24.41 kg/m2  SpO2 97% Physical Exam  Constitutional: He is oriented to person, place, and time. He appears well-developed and well-nourished.  Non-toxic appearance.  HENT:  Head: Normocephalic.  Right Ear: Tympanic membrane and external ear normal.  Left Ear: Tympanic membrane and external ear normal.  Eyes: EOM and lids are normal. Pupils are equal, round, and reactive to light.  Neck: Normal range of motion. Neck supple. Carotid bruit is not present.  Cardiovascular: Normal rate, regular rhythm, normal heart sounds, intact distal pulses and normal pulses.   Pulmonary/Chest: Breath sounds normal. No respiratory distress.  Abdominal: Soft. Bowel sounds are normal. There is no tenderness. There is no guarding.  Musculoskeletal:       Right knee: He exhibits decreased range of motion. He exhibits no swelling, no effusion, no deformity and no erythema. Tenderness found. Medial joint line and patellar tendon tenderness noted.  No posterior mass. No swelling of the inguinal nodes. DP 2+  Lymphadenopathy:       Head (right side): No submandibular adenopathy present.       Head (left side): No submandibular adenopathy present.    He has no cervical adenopathy.  Neurological: He is alert and oriented to person, place, and time. He has normal strength. No cranial nerve deficit or sensory deficit.  Skin: Skin is warm and dry.  Psychiatric: He has a normal mood and affect. His speech is normal.  Nursing note and vitals reviewed.   ED Course  Procedures (including critical care time) Labs Review Labs Reviewed - No data to display  Imaging Review No results found. I have personally reviewed and evaluated these images and lab results as part of my medical  decision-making.   EKG Interpretation None      MDM  Vital signs reviewed.  The examination is negative for acute septic issue. There is no evidence of trauma or dislocation.  The patient's mother states that his physician is only in the office 2 days of the week, and the patient is having extreme pain episodes following physical therapy. She request that the patient had "something stronger" for short period of time in the event that they cannot reach their doctor tomorrow.  I have reviewed the patient's visits. The patient has had several prescriptions, as well as many "to go packs" of narcotic pain medication. I discussed with the patient that the pain management would need to be handled by his pain management specialist,  or his orthopedic physician. The patient was treated in the emergency department with intramuscular Toradol, Decadron, Zofran, and oral Norco.   The patient's mother again came to the dentist to see if he could receive a to go pack of medication. Reassured the patient's mother that he was being treated with intramuscular medication, and oral narcotics here in the emergency department. I ask her to have additional medications needed addressed by her specialist, or her orthopedic MD.   Final diagnoses:  Right knee pain  Complex regional pain syndrome    **I have reviewed nursing notes, vital signs, and all appropriate lab and imaging results for this patient.Ivery Quale, PA-C 01/24/16 1543  Bethann Berkshire, MD 01/25/16 [phone]

## 2016-01-24 NOTE — ED Notes (Signed)
Pt reports pain in r knee x 3 days.  Reports has chronic knee pain and his doctor is out of the office.

## 2016-01-24 NOTE — ED Notes (Signed)
Patient given discharge instruction, verbalized understand. Patient ambulatory out of the department.  

## 2016-01-24 NOTE — Discharge Instructions (Signed)
I have reviewed your vital signs. No acute changes at this time. Your examination is negative for septic joint, or acute problem. You were treated today with intramuscular Toradol, and Decadron. Please see your pain management physician, or your orthopedic physician on tomorrow to continue the care concerning your chronic knee pain. Knee Pain Knee pain is a very common symptom and can have many causes. Knee pain often goes away when you follow your health care provider's instructions for relieving pain and discomfort at home. However, knee pain can develop into a condition that needs treatment. Some conditions may include:  Arthritis caused by wear and tear (osteoarthritis).  Arthritis caused by swelling and irritation (rheumatoid arthritis or gout).  A cyst or growth in your knee.  An infection in your knee joint.  An injury that will not heal.  Damage, swelling, or irritation of the tissues that support your knee (torn ligaments or tendinitis). If your knee pain continues, additional tests may be ordered to diagnose your condition. Tests may include X-rays or other imaging studies of your knee. You may also need to have fluid removed from your knee. Treatment for ongoing knee pain depends on the cause, but treatment may include:  Medicines to relieve pain or swelling.  Steroid injections in your knee.  Physical therapy.  Surgery. HOME CARE INSTRUCTIONS  Take medicines only as directed by your health care provider.  Rest your knee and keep it raised (elevated) while you are resting.  Do not do things that cause or worsen pain.  Avoid high-impact activities or exercises, such as running, jumping rope, or doing jumping jacks.  Apply ice to the knee area:  Put ice in a plastic bag.  Place a towel between your skin and the bag.  Leave the ice on for 20 minutes, 2-3 times a day.  Ask your health care provider if you should wear an elastic knee support.  Keep a pillow under  your knee when you sleep.  Lose weight if you are overweight. Extra weight can put pressure on your knee.  Do not use any tobacco products, including cigarettes, chewing tobacco, or electronic cigarettes. If you need help quitting, ask your health care provider. Smoking may slow the healing of any bone and joint problems that you may have. SEEK MEDICAL CARE IF:  Your knee pain continues, changes, or gets worse.  You have a fever along with knee pain.  Your knee buckles or locks up.  Your knee becomes more swollen. SEEK IMMEDIATE MEDICAL CARE IF:   Your knee joint feels hot to the touch.  You have chest pain or trouble breathing.   This information is not intended to replace advice given to you by your health care provider. Make sure you discuss any questions you have with your health care provider.   Document Released: 08/25/2007 Document Revised: 11/18/2014 Document Reviewed: 06/13/2014 Elsevier Interactive Patient Education 2016 [employer]  Complex Regional Pain Syndrome Complex regional pain syndrome (CRPS) is a nerve disorder that causes long-lasting (chronic) pain, usually in a hand, arm, leg, or foot. CRPS usually follows an injury or trauma, such as a fracture or sprain. There are two types of CRPS:  Type 1. This type occurs after an injury or trauma with no known damage to a nerve.  Type 2. This type occurs after injury or trauma damages a nerve. There are three stages of the condition:  Stage 1. This stage, called the acute stage, may last for three months.  Stage 2.  This stage, called the dystrophic stage, may last for three to 12 months.  Stage 3. This stage, called the atrophic stage, may start after one year. CRPS ranges from mild to severe. For most people CRPS is mild and recovery happens over time. For others, CRPS lasts a very long time and is debilitating. CAUSES The exact cause of CRPS is not known. RISK FACTORS You may be at increased risk if:  You  are a woman.  You are approximately 20 years of age.  You have any of the following:  A family history of CRPS.  An injury or surgery.  An infection.  Cancer.  Neck problems.  A stroke.  A heart attack.  Asthma. SIGNS AND SYMPTOMS Signs and symptoms in the affected limb are different for each stage. Signs and symptoms of stage 1 include:  Burning pain.  A pins and needles sensation.  Extremely sensitive skin.  Swelling.  Joint stiffness.  Warmth and redness.  Excessive sweating.  Hair and nail growth that is faster than normal. Signs and symptoms of stage 2 include:  Spreading of pain to the whole limb.  Increased skin sensitivity.  Increased swelling and stiffness.  Coolness of the skin.  Blue discoloration of skin.  Loss of skin wrinkles.  Brittle fingernails. Signs and symptoms of stage 3 include:   Pain that spreads to other areas of the body but becomes less severe.  More stiffness, leading to loss of motion.  Skin that is pale, dry, shiny, and tightly stretched. DIAGNOSIS There is no test to diagnose CRPS. Your health care provider will make a diagnosis based on your signs and symptoms and a physical exam. The exam may include tests to rule out other possible causes of your symptoms. Sometimes imaging tests are done, such as an MRI or bone scan. These tests check for bone changes that might indicate CRPS.  TREATMENT Early treatment may prevent CRPS from advancing past stage 1. There is no one treatment that works for everyone. Treatment options may include:  Medicines, such as:  Nonsteroidal-anti-inflammatory drugs (NSAIDS).  Steroids.  Blood pressure drugs.  Antidepressants.  Anti-seizure drugs.  Pain relievers.  Exercise.  Occupational and physical therapy.  Biofeedback.  Mental health counseling.  Numbing injections.  Spinal surgery to implant a spinal cord stimulator or a pain pump. HOME CARE INSTRUCTIONS  Take  medicines only as directed by your health care provider.  Follow an exercise program as directed by your health care provider.  Maintain a healthy weight.  Keep all follow-up visits as directed by your health care provider. This is important. SEEK MEDICAL CARE IF:  Your symptoms change.  Your symptoms get worse.  You develop anxiety or depression.   This information is not intended to replace advice given to you by your health care provider. Make sure you discuss any questions you have with your health care provider.   Document Released: 10/18/2002 Document Revised: 11/18/2014 Document Reviewed: 07/25/2014 Elsevier Interactive Patient Education [employer]

## 2016-08-04 IMAGING — DX DG KNEE COMPLETE 4+V*R*
4 series · 4 of 4 positions shown · non-contrast
Comparison: Plain films right knee 05/31/2015 and MRI right knee
05/16/2015.

CLINICAL DATA: Status post fall today while using crutches with a
blow and laceration of the right knee. Subsequent encounter.

EXAM:
RIGHT KNEE - COMPLETE 4+ VIEW

[knee ap]
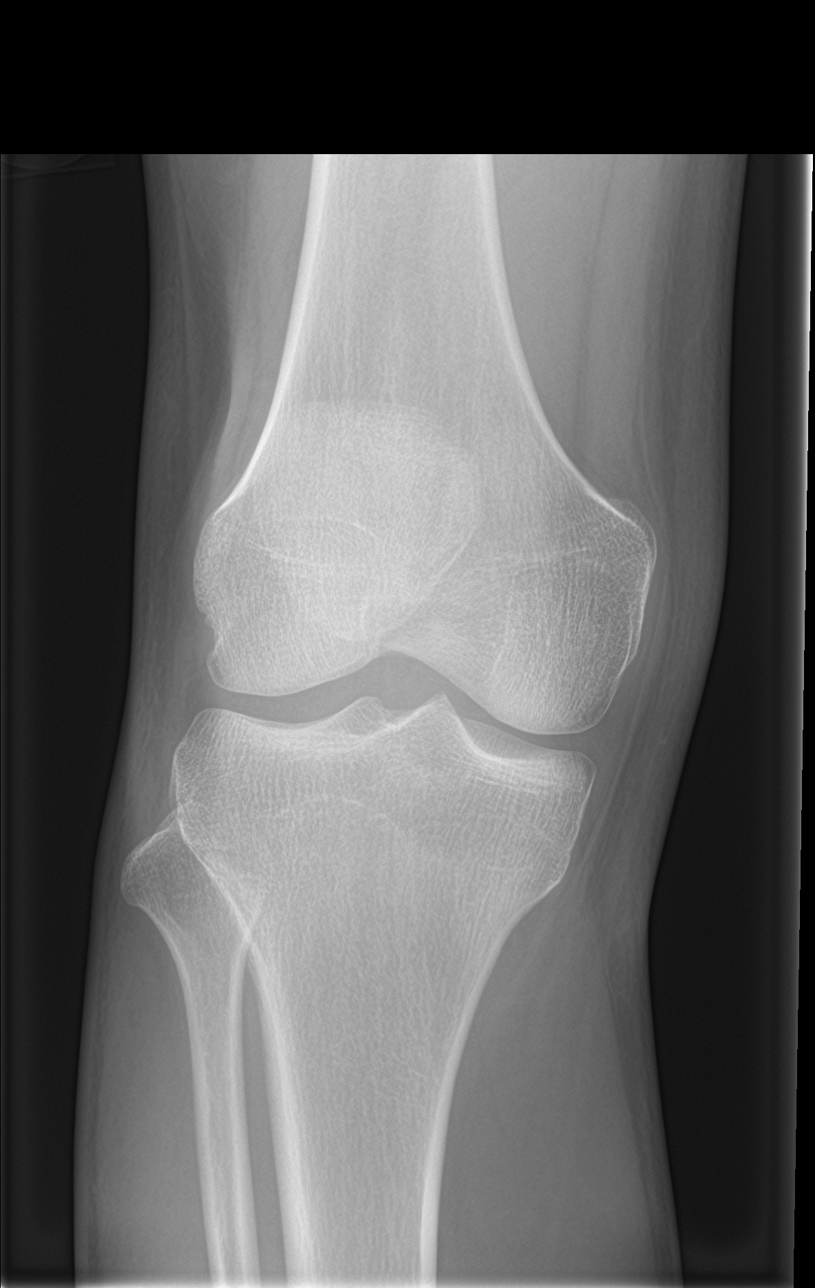

[knee obl (1 of 2)]
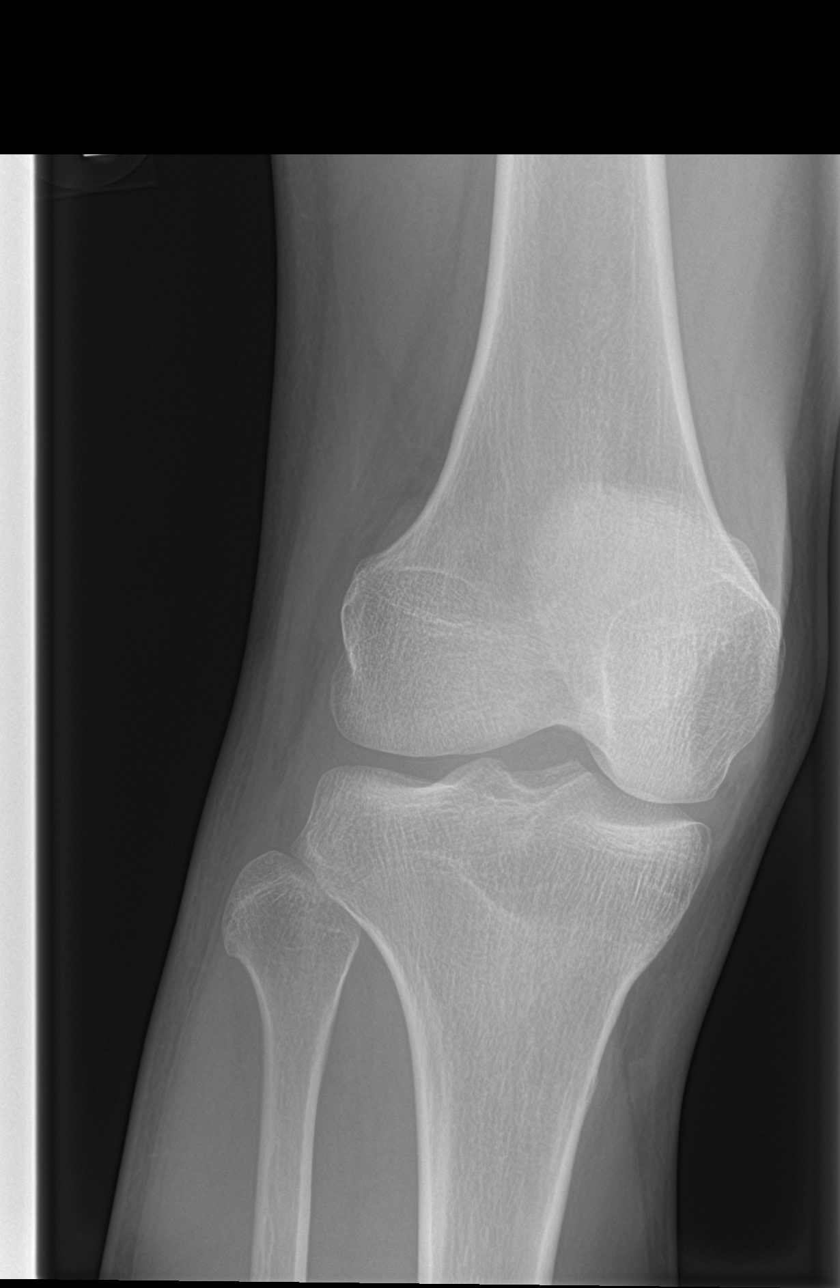

[knee obl (2 of 2)]
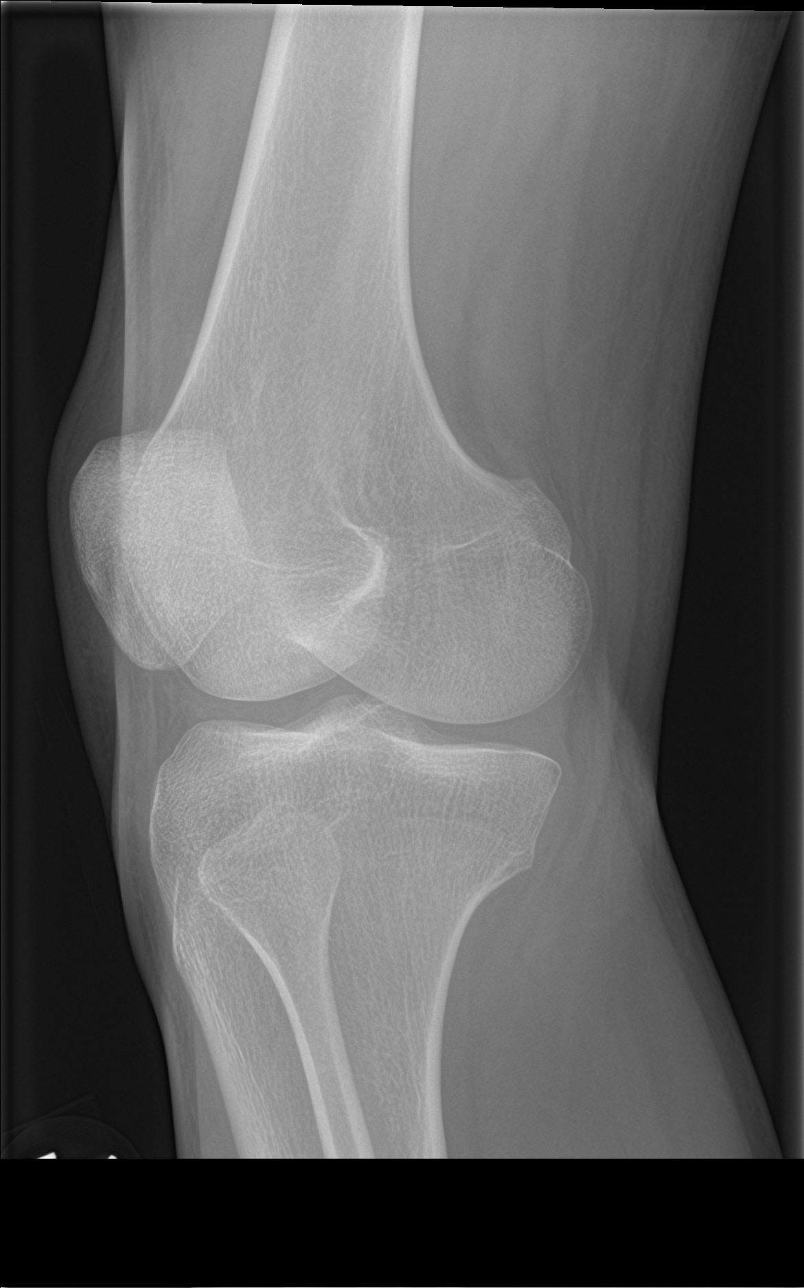

[knee lat]
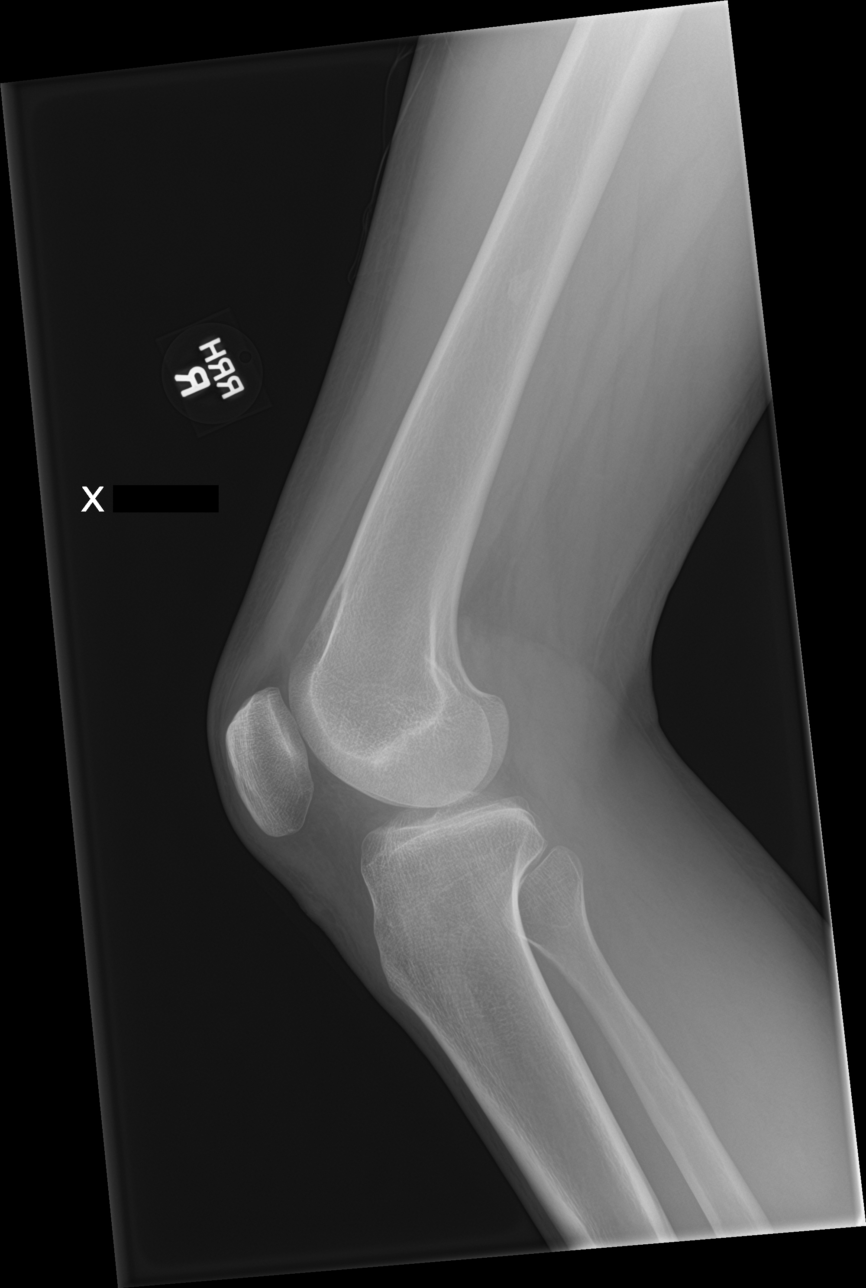

[4 of 4 positions shown; findings below may reference images not displayed]

FINDINGS: There is no evidence of fracture, dislocation, or joint effusion.
There is no evidence of arthropathy or other focal bone abnormality.
Soft tissues are unremarkable. No radiopaque foreign body is
identified.
IMPRESSION: Negative exam.

## 2016-08-16 IMAGING — DX DG KNEE COMPLETE 4+V*L*
4 series · 4 of 4 positions shown · non-contrast
Comparison: None.

CLINICAL DATA: Patient with popping sensation in the right knee.
Associated pain. History of Osgood-Schlatter's disease. Initial
encounter.

EXAM:
LEFT KNEE - COMPLETE 4+ VIEW

[knee ap]
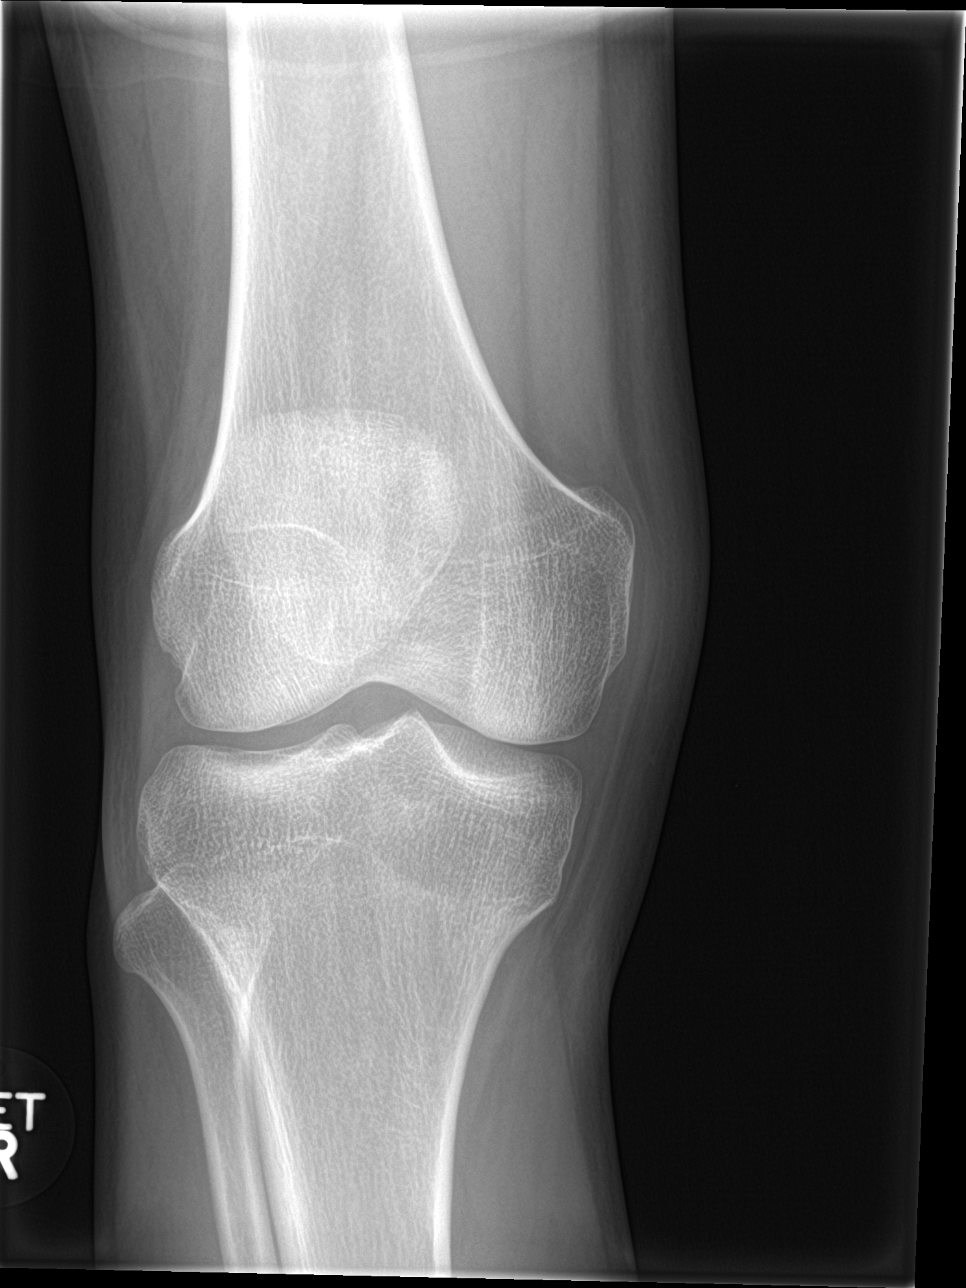

[knee obl (1 of 2)]
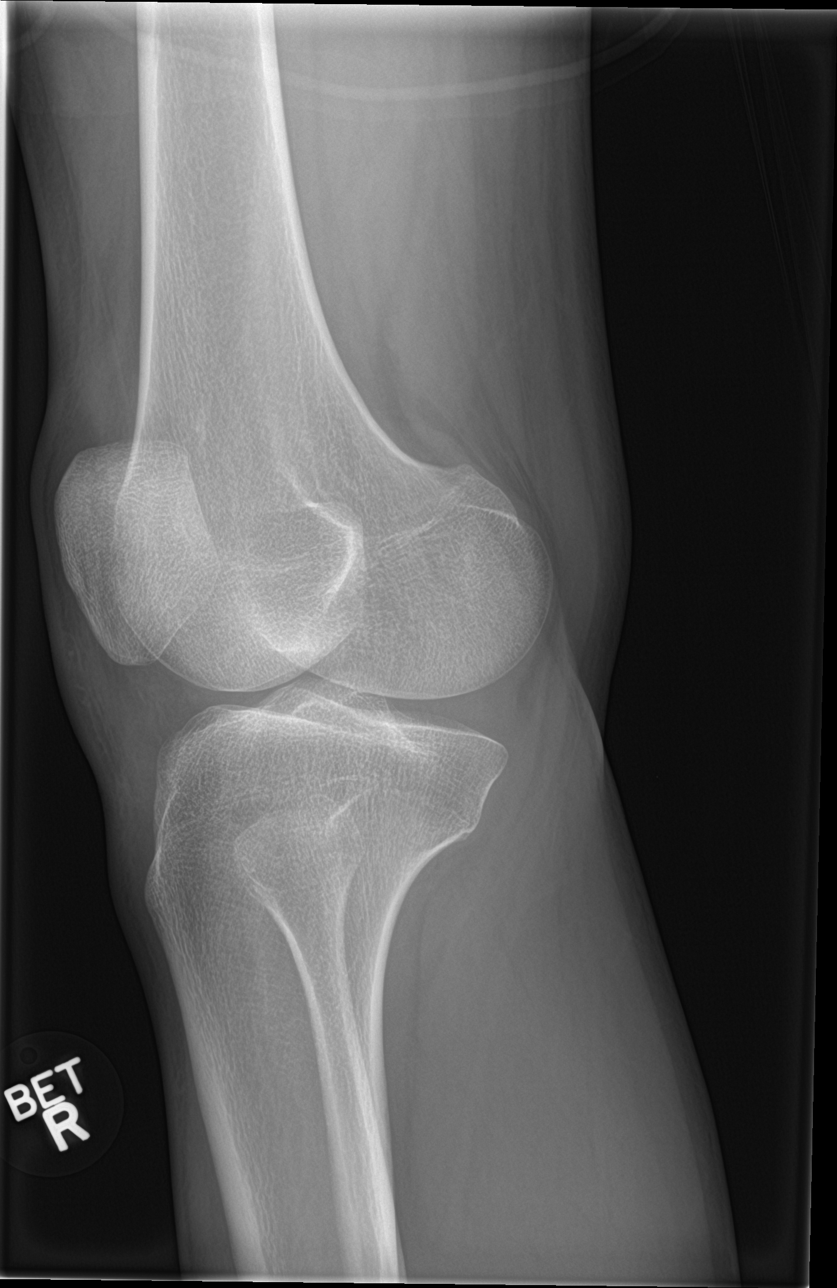

[knee obl (2 of 2)]
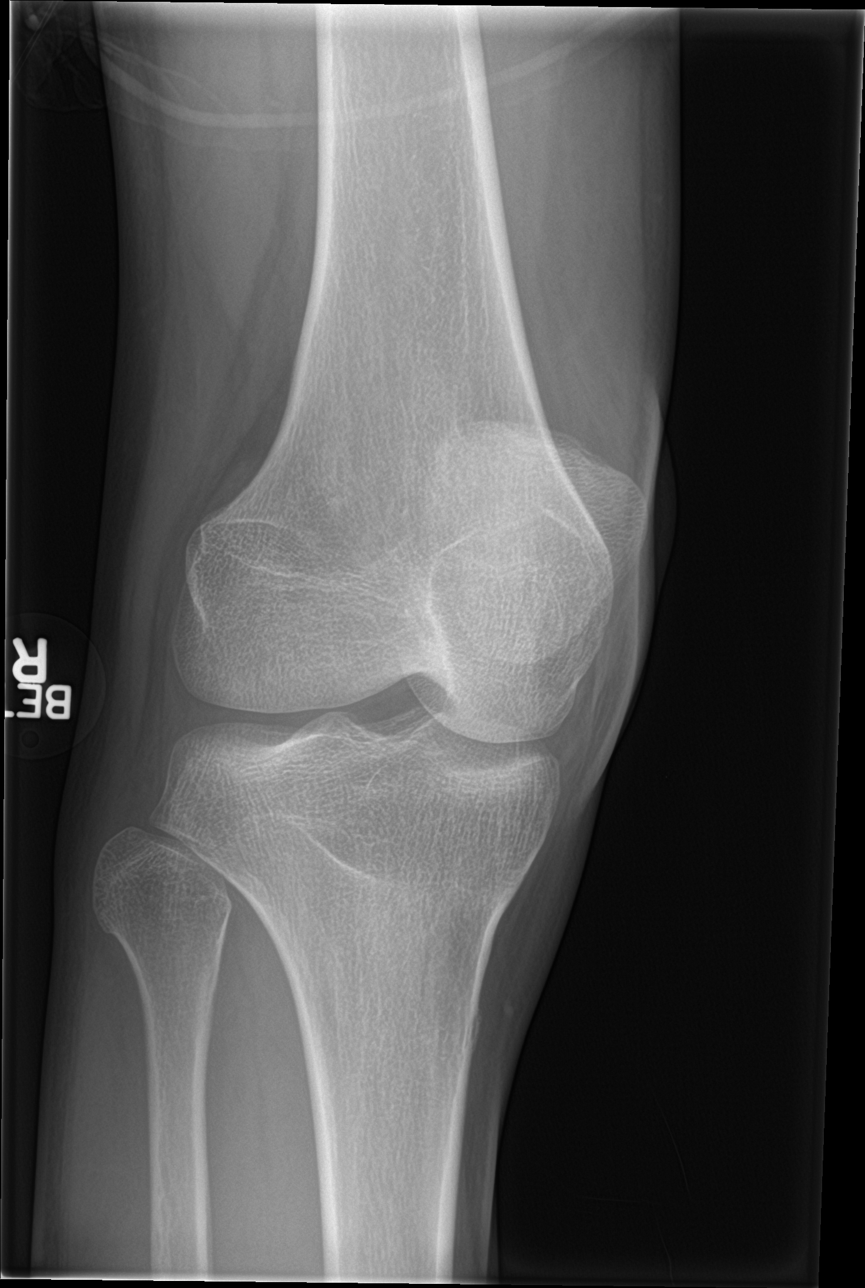

[knee lat]
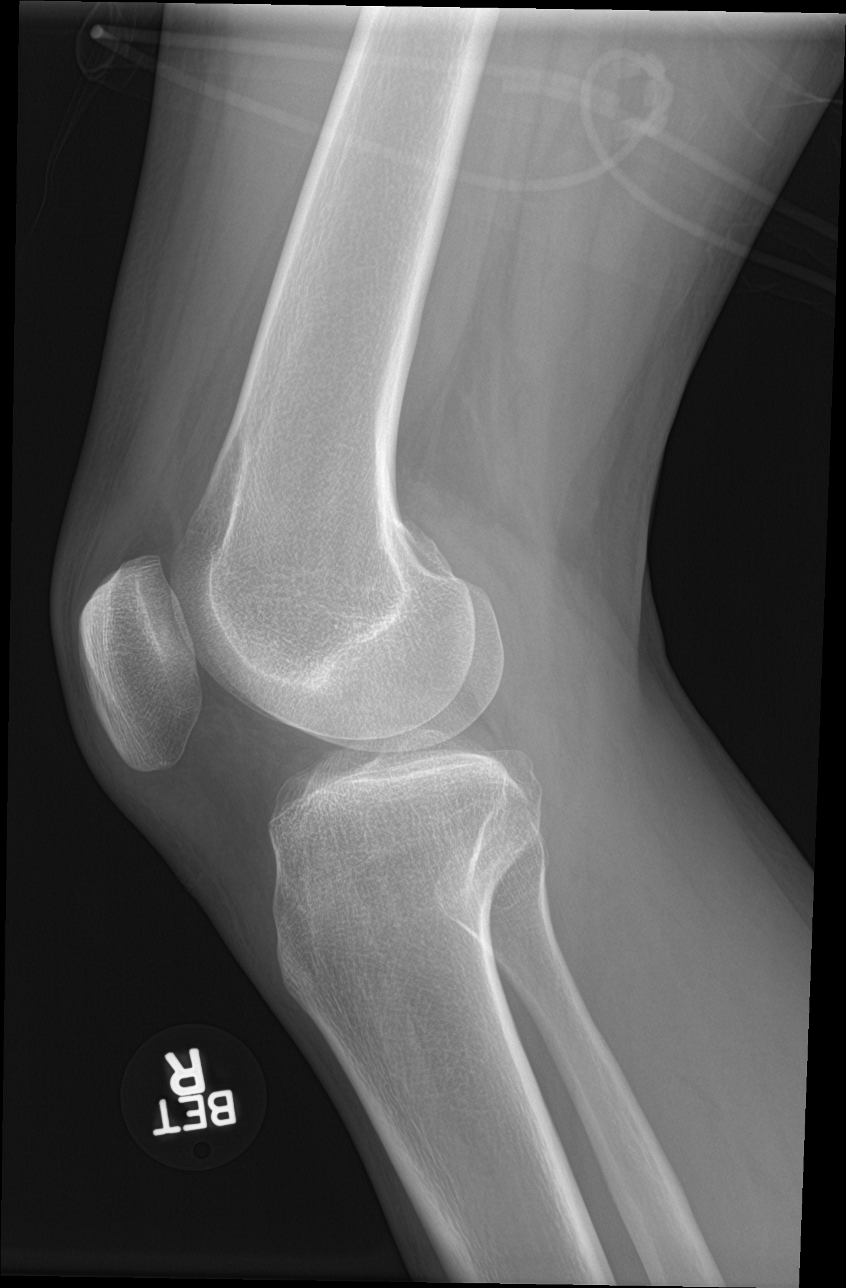

[4 of 4 positions shown; findings below may reference images not displayed]

FINDINGS: There is no evidence of fracture, dislocation, or joint effusion.
There is no evidence of arthropathy or other focal bone abnormality.
Soft tissues are unremarkable.
IMPRESSION: No acute osseous abnormality.
